# Patient Record
Sex: Female | Born: 1990
Health system: Southern US, Community
[De-identification: ages and names within clinical notes are randomized; demographics above are authoritative.]

## PROBLEM LIST (undated history)

## (undated) DIAGNOSIS — R030 Elevated blood-pressure reading, without diagnosis of hypertension: Secondary | ICD-10-CM

## (undated) DIAGNOSIS — E049 Nontoxic goiter, unspecified: Secondary | ICD-10-CM

## (undated) DIAGNOSIS — F32A Depression, unspecified: Secondary | ICD-10-CM

## (undated) DIAGNOSIS — F419 Anxiety disorder, unspecified: Secondary | ICD-10-CM

## (undated) DIAGNOSIS — R7303 Prediabetes: Secondary | ICD-10-CM

## (undated) HISTORY — DX: Depression, unspecified: F32.A

## (undated) HISTORY — DX: Nontoxic goiter, unspecified: E04.9

## (undated) HISTORY — DX: Elevated blood-pressure reading, without diagnosis of hypertension: R03.0

## (undated) HISTORY — DX: Prediabetes: R73.03

## (undated) HISTORY — DX: Anxiety disorder, unspecified: F41.9

---
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
## 2011-02-13 ENCOUNTER — Other Ambulatory Visit: Payer: Self-pay | Admitting: Certified Nurse Midwife

## 2011-02-13 DIAGNOSIS — E049 Nontoxic goiter, unspecified: Secondary | ICD-10-CM

## 2011-02-15 ENCOUNTER — Other Ambulatory Visit: Payer: Self-pay

## 2011-02-26 ENCOUNTER — Ambulatory Visit
Admission: RE | Admit: 2011-02-26 | Discharge: 2011-02-26 | Disposition: A | Discharge status: Home / Self Care | Payer: 59 | Source: Ambulatory Visit | Attending: Certified Nurse Midwife | Admitting: Certified Nurse Midwife

## 2011-02-26 DIAGNOSIS — E049 Nontoxic goiter, unspecified: Secondary | ICD-10-CM

## 2014-09-28 ENCOUNTER — Other Ambulatory Visit: Payer: Self-pay | Admitting: Physician Assistant

## 2014-09-28 DIAGNOSIS — R1084 Generalized abdominal pain: Secondary | ICD-10-CM

## 2014-09-29 ENCOUNTER — Ambulatory Visit
Admission: RE | Admit: 2014-09-29 | Discharge: 2014-09-29 | Disposition: A | Discharge status: Home / Self Care | Payer: 59 | Source: Ambulatory Visit | Attending: Physician Assistant | Admitting: Physician Assistant

## 2014-09-29 ENCOUNTER — Other Ambulatory Visit: Payer: Self-pay | Admitting: Physician Assistant

## 2014-09-29 ENCOUNTER — Encounter (INDEPENDENT_AMBULATORY_CARE_PROVIDER_SITE_OTHER): Payer: Self-pay

## 2014-09-29 DIAGNOSIS — R1084 Generalized abdominal pain: Secondary | ICD-10-CM

## 2015-03-24 ENCOUNTER — Ambulatory Visit (INDEPENDENT_AMBULATORY_CARE_PROVIDER_SITE_OTHER): Payer: 59 | Admitting: Certified Nurse Midwife

## 2015-03-24 ENCOUNTER — Encounter: Payer: Self-pay | Admitting: Certified Nurse Midwife

## 2015-03-24 VITALS — BP 126/84 | HR 76 | Resp 20 | Ht 65.75 in | Wt 201.4 lb

## 2015-03-24 DIAGNOSIS — Z124 Encounter for screening for malignant neoplasm of cervix: Secondary | ICD-10-CM

## 2015-03-24 DIAGNOSIS — Z0000 Encounter for general adult medical examination without abnormal findings: Secondary | ICD-10-CM | POA: Diagnosis not present

## 2015-03-24 DIAGNOSIS — E049 Nontoxic goiter, unspecified: Secondary | ICD-10-CM

## 2015-03-24 DIAGNOSIS — Z01419 Encounter for gynecological examination (general) (routine) without abnormal findings: Secondary | ICD-10-CM | POA: Diagnosis not present

## 2015-03-24 LAB — POCT URINALYSIS DIPSTICK
Bilirubin, UA: NEGATIVE
Glucose, UA: NEGATIVE
Ketones, UA: NEGATIVE
Nitrite, UA: NEGATIVE
PH UA: 5
PROTEIN UA: NEGATIVE
Urobilinogen, UA: NEGATIVE

## 2015-03-24 LAB — HEMOGLOBIN, FINGERSTICK: Hemoglobin, fingerstick: 12.4 g/dL (ref 12.0–16.0)

## 2015-03-24 NOTE — Progress Notes (Signed)
24 y.o. G0P0000 Single  African American Fe here for annual exam.   Periods normal, except for missed period February and then long period in 3/16 and 4/16 normal and on period today. Denies urinary symptoms of urgency, frequency or pain. Never been sexually active. Sees PCP for aex/ labs. No issues at visit. Patient working on healthy lifestyle with exercise and diet. Has lost 23 pounds. Patient has enlarged known thyroid with nodules( diagnosed here was benign appearing no follow up indicated unless change in size). No other health issues.  Patient's last menstrual period was 03/21/2015.          Sexually active: No.  The current method of family planning is Abstinence.    Exercising: Yes.    cardio qd Smoker:  no  Health Maintenance: Pap:  Never had one  MMG:  Never had one Colonoscopy: never had one  BMD:   Never had one TDaP:  2008 Labs: Hgb: 12.4   ; Urine: WBC'S +; RBC's ++ (due to being on cycle) Self breast exam: done occ   reports that she has never smoked. She does not have any smokeless tobacco history on file. She reports that she does not drink alcohol.  Past Medical History  Diagnosis Date  . Enlarged thyroid     Past Surgical History  Procedure Laterality Date  . Wisdom tooth extraction  2008    No current outpatient prescriptions on file.   No current facility-administered medications for this visit.    Family History  Problem Relation Age of Onset  . Breast cancer Maternal Grandmother [id number]  . Hypertension Mother   . High Cholesterol Father     ROS:  Pertinent items are noted in HPI.  Otherwise, a comprehensive ROS was negative.  Exam:   BP 126/84 mmHg  Pulse 76  Resp 20  Ht 5' 5.75" (1.67 m)  Wt 201 lb 6.4 oz (91.354 kg)  BMI 32.76 kg/m2  LMP 03/21/2015 Height: 5' 5.75" (167 cm) Ht Readings from Last 3 Encounters:  03/24/15 5' 5.75" (1.67 m)    General appearance: alert, cooperative and appears stated age Head: Normocephalic, without obvious  abnormality, atraumatic Neck: no adenopathy, supple, symmetrical, trachea midline and thyroid enlarged and nodular, right larger than left. Lungs: clear to auscultation bilaterally Breasts: normal appearance, no masses or tenderness, No nipple retraction or dimpling, No nipple discharge or bleeding, No axillary or supraclavicular adenopathy, large pendulous Heart: regular rate and rhythm Abdomen: soft, non-tender; no masses,  no organomegaly Extremities: extremities normal, atraumatic, no cyanosis or edema Skin: Skin color, texture, turgor normal. No rashes or lesions Lymph nodes: Cervical, supraclavicular, and axillary nodes normal. No abnormal inguinal nodes palpated Neurologic: Grossly normal   Pelvic: External genitalia:  no lesions              Urethra:  normal appearing urethra with no masses, tenderness or lesions              Bartholin's and Skene's: normal                 Vagina: normal appearing vagina with normal color and discharge, no lesions              Cervix: normal,non tender, no lesions, blood present from menses              Pap taken: Yes.   Bimanual Exam:  Uterus:  normal size, contour, position, consistency, mobility, non-tender  Adnexa: normal adnexa and no mass, fullness, tenderness               Rectovaginal: Confirms               Anus:  normal appearance  Chaperone present: Yes  A:  Well Woman with normal exam  Contraception none needed, never sexually active  Enlarged thyroid needs Thyroid US follow up  P:   Reviewed health and wellness pertinent to exam  Will advise if needed  Discussed need to check thyroid with US again to being enlarged more on right than left. Patient agreeable. Will be called with appointment.  Lab TSH  Pap smear taken today with HPV reflex   counseled on breast self exam, STD prevention, HIV risk factors and prevention, adequate intake of calcium and vitamin D, diet and exercise  return annually or prn  An After  Visit Summary was printed and given to the patient.

## 2015-03-24 NOTE — Patient Instructions (Signed)
General topics  Next pap or exam is  due in 1 year Take a Women's multivitamin Take 1200 mg. of calcium daily - prefer dietary If any concerns in interim to call back  Breast Self-Awareness Practicing breast self-awareness may pick up problems early, prevent significant medical complications, and possibly save your life. By practicing breast self-awareness, you can become familiar with how your breasts look and feel and if your breasts are changing. This allows you to notice changes early. It can also offer you some reassurance that your breast health is good. One way to learn what is normal for your breasts and whether your breasts are changing is to do a breast self-exam. If you find a lump or something that was not present in the past, it is best to contact your caregiver right away. Other findings that should be evaluated by your caregiver include nipple discharge, especially if it is bloody; skin changes or reddening; areas where the skin seems to be pulled in (retracted); or new lumps and bumps. Breast pain is seldom associated with cancer (malignancy), but should also be evaluated by a caregiver. BREAST SELF-EXAM The best time to examine your breasts is 5 7 days after your menstrual period is over.  ExitCare Patient Information 2013 [employer].   Exercise to Stay Healthy Exercise helps you become and stay healthy. EXERCISE IDEAS AND TIPS Choose exercises that:  You enjoy.  Fit into your day. You do not need to exercise really hard to be healthy. You can do exercises at a slow or medium level and stay healthy. You can:  Stretch before and after working out.  Try yoga, Pilates, or tai chi.  Lift weights.  Walk fast, swim, jog, run, climb stairs, bicycle, dance, or rollerskate.  Take aerobic classes. Exercises that burn about 150 calories:  Running 1  miles in 15 minutes.  Playing volleyball for 45 to 60 minutes.  Washing and waxing a car for 45 to 60  minutes.  Playing touch football for 45 minutes.  Walking 1  miles in 35 minutes.  Pushing a stroller 1  miles in 30 minutes.  Playing basketball for 30 minutes.  Raking leaves for 30 minutes.  Bicycling 5 miles in 30 minutes.  Walking 2 miles in 30 minutes.  Dancing for 30 minutes.  Shoveling snow for 15 minutes.  Swimming laps for 20 minutes.  Walking up stairs for 15 minutes.  Bicycling 4 miles in 15 minutes.  Gardening for 30 to 45 minutes.  Jumping rope for 15 minutes.  Washing windows or floors for 45 to 60 minutes. Document Released: 12/01/2010 Document Revised: 01/21/2012 Document Reviewed: 12/01/2010 ExitCare Patient Information 2013 [employer].   Other topics ( that may be useful information):    Sexually Transmitted Disease Sexually transmitted disease (STD) refers to any infection that is passed from person to person during sexual activity. This may happen by way of saliva, semen, blood, vaginal mucus, or urine. Common STDs include:  Gonorrhea.  Chlamydia.  Syphilis.  HIV/AIDS.  Genital herpes.  Hepatitis B and C.  Trichomonas.  Human papillomavirus (HPV).  Pubic lice. CAUSES  An STD may be spread by bacteria, virus, or parasite. A person can get an STD by:  Sexual intercourse with an infected person.  Sharing sex toys with an infected person.  Sharing needles with an infected person.  Having intimate contact with the genitals, mouth, or rectal areas of an infected person. SYMPTOMS  Some people may not have any symptoms, but   they can still pass the infection to others. Different STDs have different symptoms. Symptoms include:  Painful or bloody urination.  Pain in the pelvis, abdomen, vagina, anus, throat, or eyes.  Skin rash, itching, irritation, growths, or sores (lesions). These usually occur in the genital or anal area.  Abnormal vaginal discharge.  Penile discharge in men.  Soft, flesh-colored skin growths in the  genital or anal area.  Fever.  Pain or bleeding during sexual intercourse.  Swollen glands in the groin area.  Yellow skin and eyes (jaundice). This is seen with hepatitis. DIAGNOSIS  To make a diagnosis, your caregiver may:  Take a medical history.  Perform a physical exam.  Take a specimen (culture) to be examined.  Examine a sample of discharge under a microscope.  Perform blood test TREATMENT   Chlamydia, gonorrhea, trichomonas, and syphilis can be cured with antibiotic medicine.  Genital herpes, hepatitis, and HIV can be treated, but not cured, with prescribed medicines. The medicines will lessen the symptoms.  Genital warts from HPV can be treated with medicine or by freezing, burning (electrocautery), or surgery. Warts may come back.  HPV is a virus and cannot be cured with medicine or surgery.However, abnormal areas may be followed very closely by your caregiver and may be removed from the cervix, vagina, or vulva through office procedures or surgery. If your diagnosis is confirmed, your recent sexual partners need treatment. This is true even if they are symptom-free or have a negative culture or evaluation. They should not have sex until their caregiver says it is okay. HOME CARE INSTRUCTIONS  All sexual partners should be informed, tested, and treated for all STDs.  Take your antibiotics as directed. Finish them even if you start to feel better.  Only take over-the-counter or prescription medicines for pain, discomfort, or fever as directed by your caregiver.  Rest.  Eat a balanced diet and drink enough fluids to keep your urine clear or pale yellow.  Do not have sex until treatment is completed and you have followed up with your caregiver. STDs should be checked after treatment.  Keep all follow-up appointments, Pap tests, and blood tests as directed by your caregiver.  Only use latex condoms and water-soluble lubricants during sexual activity. Do not use  petroleum jelly or oils.  Avoid alcohol and illegal drugs.  Get vaccinated for HPV and hepatitis. If you have not received these vaccines in the past, talk to your caregiver about whether one or both might be right for you.  Avoid risky sex practices that can break the skin. The only way to avoid getting an STD is to avoid all sexual activity.Latex condoms and dental dams (for oral sex) will help lessen the risk of getting an STD, but will not completely eliminate the risk. SEEK MEDICAL CARE IF:   You have a fever.  You have any new or worsening symptoms. Document Released: 01/19/2003 Document Revised: 01/21/2012 Document Reviewed: 01/26/2011 [facility] Patient Information 2013 [employer].    Domestic Abuse You are being battered or abused if someone close to you hits, pushes, or physically hurts you in any way. You also are being abused if you are forced into activities. You are being sexually abused if you are forced to have sexual contact of any kind. You are being emotionally abused if you are made to feel worthless or if you are constantly threatened. It is important to remember that help is available. No one has the right to abuse you. PREVENTION OF FURTHER  ABUSE  Learn the warning signs of danger. This varies with situations but may include: the use of alcohol, threats, isolation from friends and family, or forced sexual contact. Leave if you feel that violence is going to occur.  If you are attacked or beaten, report it to the police so the abuse is documented. You do not have to press charges. The police can protect you while you or the attackers are leaving. Get the officer's name and badge number and a copy of the report.  Find someone you can trust and tell them what is happening to you: your caregiver, a nurse, clergy member, close friend or family member. Feeling ashamed is natural, but remember that you have done nothing wrong. No one deserves abuse. Document Released:  10/26/2000 Document Revised: 01/21/2012 Document Reviewed: 01/04/2011 ExitCare Patient Information 2013 [employer].    How Much is Too Much Alcohol? Drinking too much alcohol can cause injury, accidents, and health problems. These types of problems can include:   Car crashes.  Falls.  Family fighting (domestic violence).  Drowning.  Fights.  Injuries.  Burns.  Damage to certain organs.  Having a baby with birth defects. ONE DRINK CAN BE TOO MUCH WHEN YOU ARE:  Working.  Pregnant or breastfeeding.  Taking medicines. Ask your doctor.  Driving or planning to drive. If you or someone you know has a drinking problem, get help from a doctor.  Document Released: 08/25/2009 Document Revised: 01/21/2012 Document Reviewed: 08/25/2009 ExitCare Patient Information 2013 [employer].   Smoking Hazards Smoking cigarettes is extremely bad for your health. Tobacco smoke has over 200 known poisons in it. There are over 60 chemicals in tobacco smoke that cause cancer. Some of the chemicals found in cigarette smoke include:   Cyanide.  Benzene.  Formaldehyde.  Methanol (wood alcohol).  Acetylene (fuel used in welding torches).  Ammonia. Cigarette smoke also contains the poisonous gases nitrogen oxide and carbon monoxide.  Cigarette smokers have an increased risk of many serious medical problems and Smoking causes approximately:  90% of all lung cancer deaths in men.  80% of all lung cancer deaths in women.  90% of deaths from chronic obstructive lung disease. Compared with nonsmokers, smoking increases the risk of:  Coronary heart disease by 2 to 4 times.  Stroke by 2 to 4 times.  Men developing lung cancer by 23 times.  Women developing lung cancer by 13 times.  Dying from chronic obstructive lung diseases by 12 times.  . Smoking is the most preventable cause of death and disease in our society.  WHY IS SMOKING ADDICTIVE?  Nicotine is the chemical  agent in tobacco that is capable of causing addiction or dependence.  When you smoke and inhale, nicotine is absorbed rapidly into the bloodstream through your lungs. Nicotine absorbed through the lungs is capable of creating a powerful addiction. Both inhaled and non-inhaled nicotine may be addictive.  Addiction studies of cigarettes and spit tobacco show that addiction to nicotine occurs mainly during the teen years, when young people begin using tobacco products. WHAT ARE THE BENEFITS OF QUITTING?  There are many health benefits to quitting smoking.   Likelihood of developing cancer and heart disease decreases. Health improvements are seen almost immediately.  Blood pressure, pulse rate, and breathing patterns start returning to normal soon after quitting. QUITTING SMOKING   American Lung Association - 1-800-LUNGUSA  American Cancer Society - 1-800-ACS-2345 Document Released: 12/06/2004 Document Revised: 01/21/2012 Document Reviewed: 08/10/2009 ExitCare Patient Information 2013 ExitCare,   LLC.   Stress Management Stress is a state of physical or mental tension that often results from changes in your life or normal routine. Some common causes of stress are:  Death of a loved one.  Injuries or severe illnesses.  Getting fired or changing jobs.  Moving into a new home. Other causes may be:  Sexual problems.  Business or financial losses.  Taking on a large debt.  Regular conflict with someone at home or at work.  Constant tiredness from lack of sleep. It is not just bad things that are stressful. It may be stressful to:  Win the lottery.  Get married.  Buy a new car. The amount of stress that can be easily tolerated varies from person to person. Changes generally cause stress, regardless of the types of change. Too much stress can affect your health. It may lead to physical or emotional problems. Too little stress (boredom) may also become stressful. SUGGESTIONS TO  REDUCE STRESS:  Talk things over with your family and friends. It often is helpful to share your concerns and worries. If you feel your problem is serious, you may want to get help from a professional counselor.  Consider your problems one at a time instead of lumping them all together. Trying to take care of everything at once may seem impossible. List all the things you need to do and then start with the most important one. Set a goal to accomplish 2 or 3 things each day. If you expect to do too many in a single day you will naturally fail, causing you to feel even more stressed.  Do not use alcohol or drugs to relieve stress. Although you may feel better for a short time, they do not remove the problems that caused the stress. They can also be habit forming.  Exercise regularly - at least 3 times per week. Physical exercise can help to relieve that "uptight" feeling and will relax you.  The shortest distance between despair and hope is often a good night's sleep.  Go to bed and get up on time allowing yourself time for appointments without being rushed.  Take a short "time-out" period from any stressful situation that occurs during the day. Close your eyes and take some deep breaths. Starting with the muscles in your face, tense them, hold it for a few seconds, then relax. Repeat this with the muscles in your neck, shoulders, hand, stomach, back and legs.  Take good care of yourself. Eat a balanced diet and get plenty of rest.  Schedule time for having fun. Take a break from your daily routine to relax. HOME CARE INSTRUCTIONS   Call if you feel overwhelmed by your problems and feel you can no longer manage them on your own.  Return immediately if you feel like hurting yourself or someone else. Document Released: 04/24/2001 Document Revised: 01/21/2012 Document Reviewed: 12/15/2007 ExitCare Patient Information 2013 ExitCare, [employer].   

## 2015-03-25 LAB — THYROID PANEL WITH TSH
FREE THYROXINE INDEX: 2 (ref 1.4–3.8)
T3 UPTAKE: 31 % (ref 22–35)
T4 TOTAL: 6.3 ug/dL (ref 4.5–12.0)
TSH: 1.852 u[IU]/mL (ref 0.350–4.500)

## 2015-03-28 LAB — IPS PAP TEST WITH REFLEX TO HPV

## 2015-03-28 NOTE — Progress Notes (Signed)
Reviewed personally.  M. Suzanne Ikechukwu Cerny, MD.  

## 2015-04-15 ENCOUNTER — Encounter: Payer: Self-pay | Admitting: Certified Nurse Midwife

## 2015-04-15 ENCOUNTER — Ambulatory Visit (INDEPENDENT_AMBULATORY_CARE_PROVIDER_SITE_OTHER): Payer: 59 | Admitting: Certified Nurse Midwife

## 2015-04-15 VITALS — BP 120/80 | HR 68 | Resp 16 | Ht 65.75 in | Wt 198.0 lb

## 2015-04-15 DIAGNOSIS — Z124 Encounter for screening for malignant neoplasm of cervix: Secondary | ICD-10-CM

## 2015-04-15 NOTE — Progress Notes (Signed)
.  24 y.o. G0P0000 SingleAfrican American  F here for repeat Pap smear due to history of repeat pap due to unsatisfactory due to on menses unable to read. on pap done 03/24/15. No other health issues.   Today she denies vaginal symptoms or STD concerns.  Patient's last menstrual period was 03/21/2015.Marland Kitchen.  Contraception:  EXAM: BP 120/80 mmHg  Pulse 68  Resp 16  Ht 5' 5.75" (1.67 m)  Wt 198 lb (89.812 kg)  BMI 32.20 kg/m2  LMP 03/21/2015 General appearance:  WNWD Female, NAD Pelvic exam: VULVA: normal appearing vulva with no masses, tenderness or lesions,  VAGINA: normal appearing vagina with normal color and discharge, no lesions,  CERVIX: normal appearing cervix without discharge or lesions. Pap smear obtained. Declines pelvic exam  Assessment: Unsatisfactory pap smear due to menses  Plan: Patient will be notified of pap results.  Rv prn

## 2015-04-18 LAB — IPS PAP TEST WITH REFLEX TO HPV

## 2015-04-18 NOTE — Progress Notes (Signed)
Reviewed personally.  M. Suzanne Stevie [place], MD.  

## 2015-04-29 ENCOUNTER — Ambulatory Visit
Admission: RE | Admit: 2015-04-29 | Discharge: 2015-04-29 | Disposition: A | Discharge status: Home / Self Care | Payer: 59 | Source: Ambulatory Visit | Attending: Certified Nurse Midwife | Admitting: Certified Nurse Midwife

## 2015-04-29 DIAGNOSIS — E049 Nontoxic goiter, unspecified: Secondary | ICD-10-CM

## 2015-05-17 ENCOUNTER — Telehealth: Payer: Self-pay | Admitting: Emergency Medicine

## 2015-05-17 DIAGNOSIS — E042 Nontoxic multinodular goiter: Secondary | ICD-10-CM

## 2015-05-17 NOTE — Telephone Encounter (Signed)
-----   Message from Verner Choleborah S Leonard, CNM sent at 05/17/2015  8:04 AM EDT ----- Notify patient that US showed multi nodular thyroid and feel she should be referred to Endocrinology for follow up . Please refer

## 2015-05-17 NOTE — Telephone Encounter (Signed)
Message left to return call to Valeria Krisko at [phone].    

## 2015-05-19 NOTE — Telephone Encounter (Signed)
Call to patient. She is given message from Verner Choleborah S. Leonard CNM and is agreeable to referral.   Referral placed to [facility].

## 2015-05-19 NOTE — Telephone Encounter (Signed)
Referral placed for Dr. Romero BellingSean Ellison at [facility]. Patient scheduled for 05/26/15 at 1:45 arrival. Patient contacted and given detailed appointment information and agreeable. Routing to provider for final review. Patient agreeable to disposition. Will close encounter.    cc Lilyan GilfordBecky Frahm

## 2015-05-26 ENCOUNTER — Encounter: Payer: Self-pay | Admitting: Endocrinology

## 2015-05-26 ENCOUNTER — Ambulatory Visit (INDEPENDENT_AMBULATORY_CARE_PROVIDER_SITE_OTHER): Payer: 59 | Admitting: Endocrinology

## 2015-05-26 VITALS — BP 132/84 | HR 62 | Temp 98.3°F | Ht 65.75 in | Wt 194.0 lb

## 2015-05-26 DIAGNOSIS — E042 Nontoxic multinodular goiter: Secondary | ICD-10-CM | POA: Diagnosis not present

## 2015-05-26 NOTE — Progress Notes (Signed)
   Subjective:    Patient ID: Robin Hunt, female    DOB: 12-Oct-1991, 24 y.o.   MRN: [id number]  HPI Pt was noted on a routine physical, to have a goiter at the thyroid in 2012.  she has no h/o XRT or surgery to the neck.  She has a slight sensation swelling at the right lateral neck, but no assoc pain.   Past Medical History  Diagnosis Date  . Enlarged thyroid     Past Surgical History  Procedure Laterality Date  . Wisdom tooth extraction  2008    History   Social History  . Marital Status: Single    Spouse Name: N/A  . Number of Children: N/A  . Years of Education: N/A   Occupational History  . Not on file.   Social History Main Topics  . Smoking status: Never Smoker   . Smokeless tobacco: Not on file  . Alcohol Use: No  . Drug Use: Not on file  . Sexual Activity: No   Other Topics Concern  . Not on file   Social History Narrative    No current outpatient prescriptions on file prior to visit.   No current facility-administered medications on file prior to visit.    Allergies  Allergen Reactions  . Septra [Sulfamethoxazole-Trimethoprim] Hives    Family History  Problem Relation Age of Onset  . Breast cancer Maternal Grandmother [id number]  . Hypertension Mother   . High Cholesterol Father   . Thyroid disease Neg Hx     BP 132/84 mmHg  Pulse 62  Temp(Src) 98.3 F (36.8 C) (Oral)  Ht 5' 5.75" (1.67 m)  Wt 194 lb (87.998 kg)  BMI 31.55 kg/m2  SpO2 98%  LMP 05/16/2015   Review of Systems Denies dysphagia, sob, depression, hair loss, muscle cramps, constipation, numbness, blurry vision, myalgias, dry skin, rhinorrhea, easy bruising, and syncope.  She has chronic weight gain and cold intolerance.  She has irreg menses.      Objective:   Physical Exam VITAL SIGNS:  See vs page GENERAL: no distress eyes: no periorbital swelling, no proptosis NECK: There is no palpable thyroid enlargement.  No thyroid nodule is palpable.   NODES: no palpable  lymphadenopathy at the anterior neck. Skin: not diaphoretic.   Neuro: no tremor.  EXT: no edema PSYCH: Alert and well-oriented.  Does not appear anxious nor depressed.    Radiol: thyroid US (04/29/15): Multinodular thyroid, with none of the lesions meeting criteria for Biopsy.  Lab Results  Component Value Date   TSH 1.852 03/24/2015   T4TOTAL 6.3 03/24/2015   i have reviewed outside records: ov of 03/24/15): pt was noted to have h/o multinodular goiter, and US was rechecked.      Assessment & Plan:  Multinodular goiter, new to me.   Weight gain, and other sxs: I told pt these sxs are not thyroid-related, and should be addressed separately.    Patient is advised the following: Patient Instructions  Please have the thyroid ultrasound rechecked in 2 years.  You could come back here for an appointment then, or Ms. Redmon could request for you. most of the time, a "lumpy thyroid" will eventually become overactive.  this is usually a slow process, happening over the span of many years.

## 2015-05-26 NOTE — Patient Instructions (Signed)
Please have the thyroid ultrasound rechecked in 2 years.  You could come back here for an appointment then, or Ms. Redmon could request for you. most of the time, a "lumpy thyroid" will eventually become overactive.  this is usually a slow process, happening over the span of many years.

## 2015-05-28 DIAGNOSIS — E042 Nontoxic multinodular goiter: Secondary | ICD-10-CM | POA: Insufficient documentation

## 2015-08-04 IMAGING — US US SOFT TISSUE HEAD/NECK
1 series · 14 of 25 positions shown · non-contrast
Comparison: 02/26/2011

CLINICAL DATA: 23-year-old female with a history of thyroid
nodules.

EXAM:
THYROID ULTRASOUND
TECHNIQUE: Ultrasound examination of the thyroid gland and adjacent soft
tissues was performed.

[Series 1: us soft tissue head/neck · 0.07mm/px · 14 of 83 slices shown]
[im 1/83]
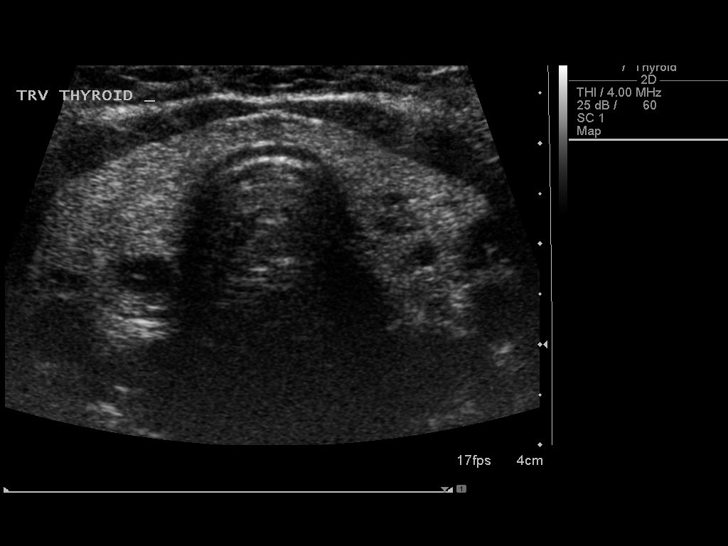
[im 7/83]
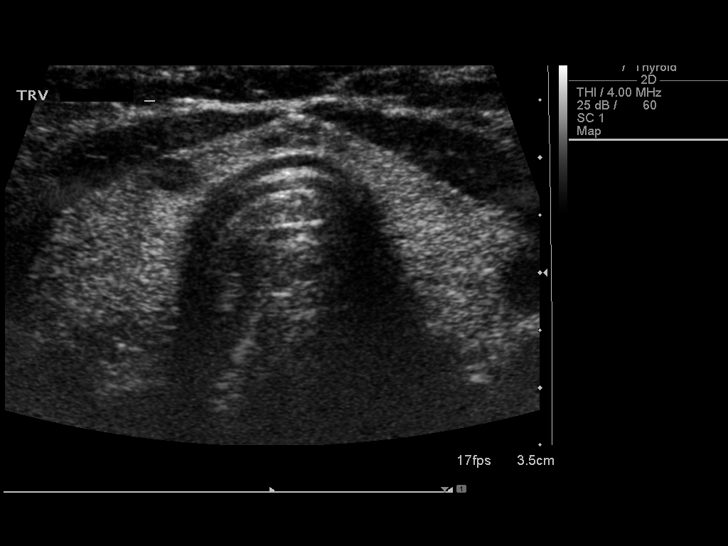
[im 14/83]
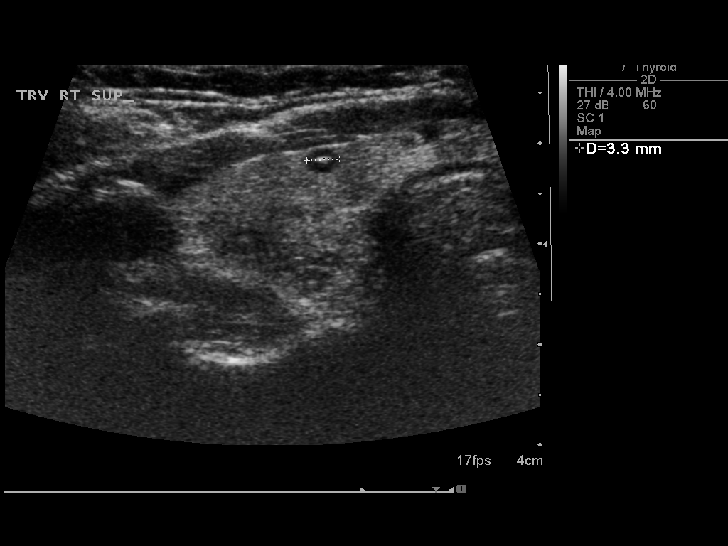
[im 21/83]
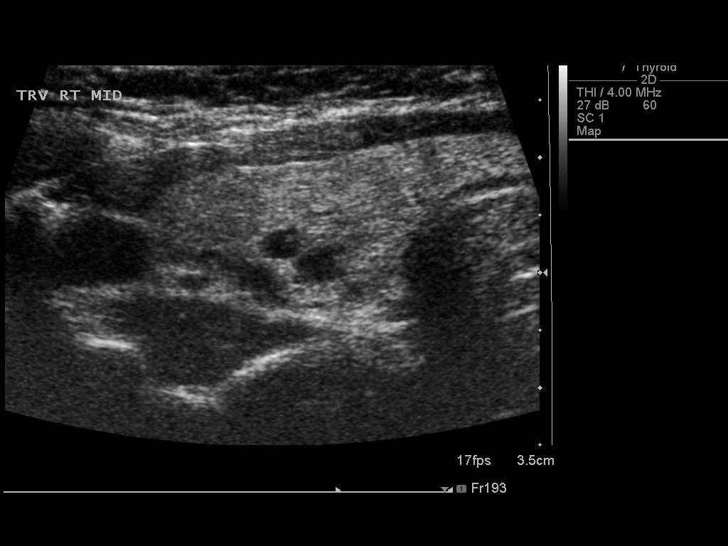
[im 28/83]
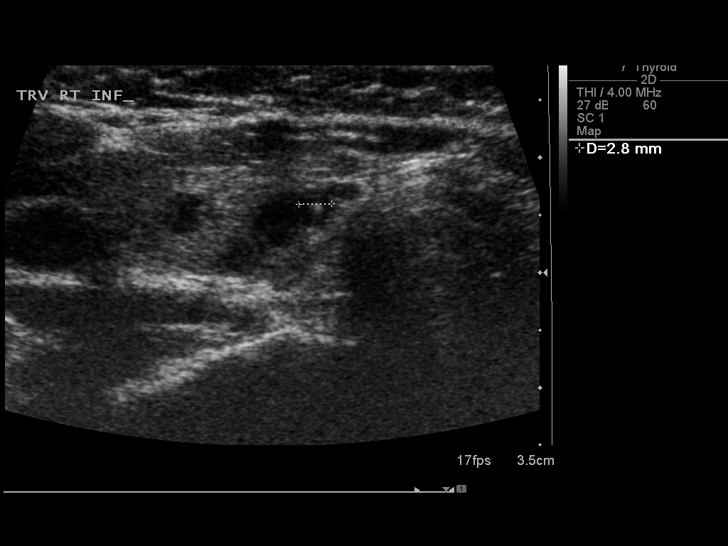
[im 31/83]
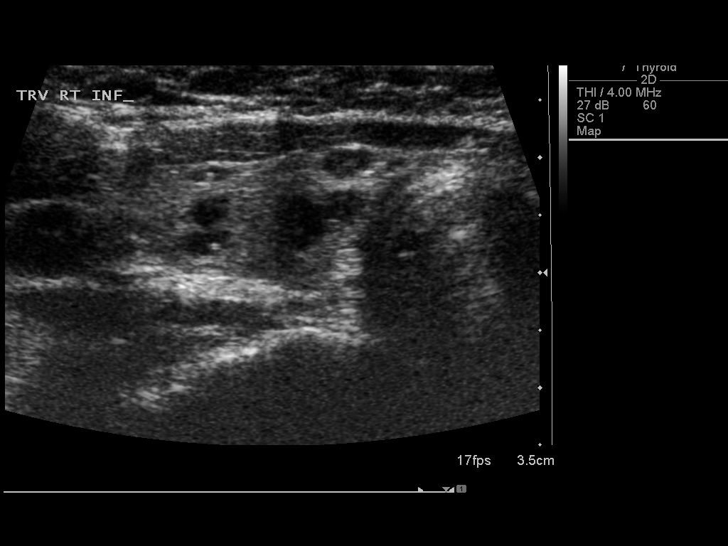
[im 38/83]
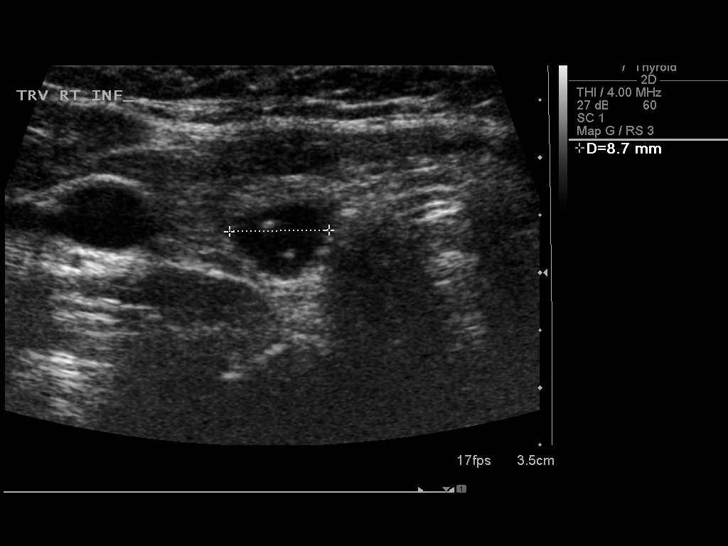
[im 45/83]
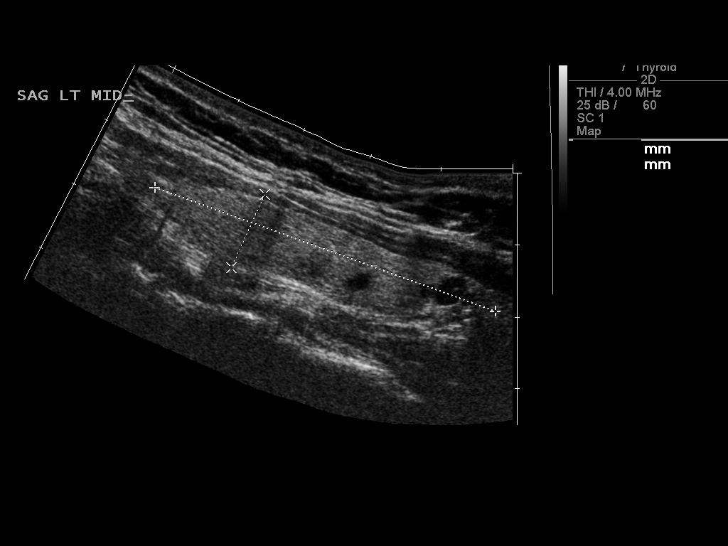
[im 52/83]
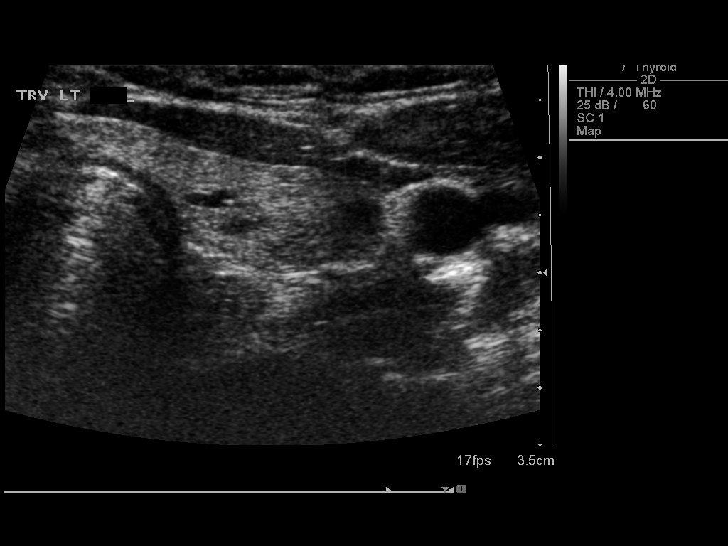
[im 55/83]
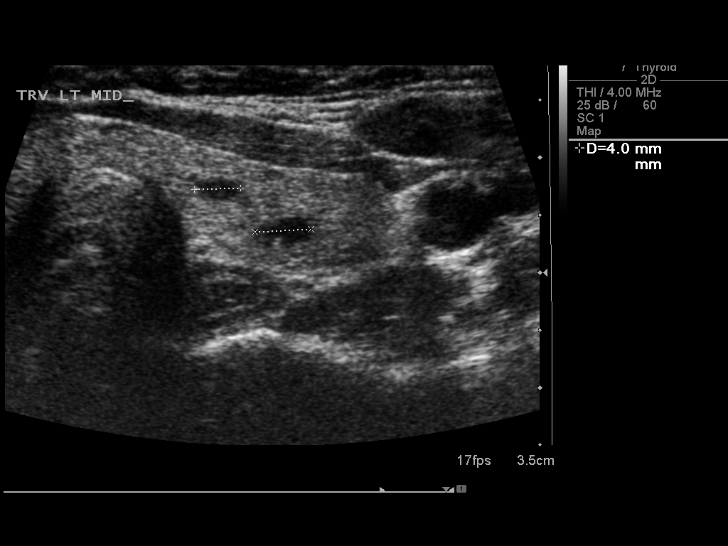
[im 62/83]
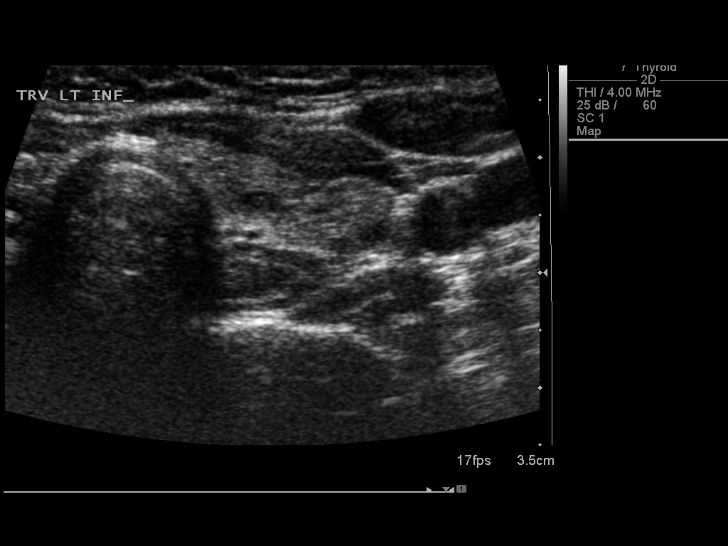
[im 69/83]
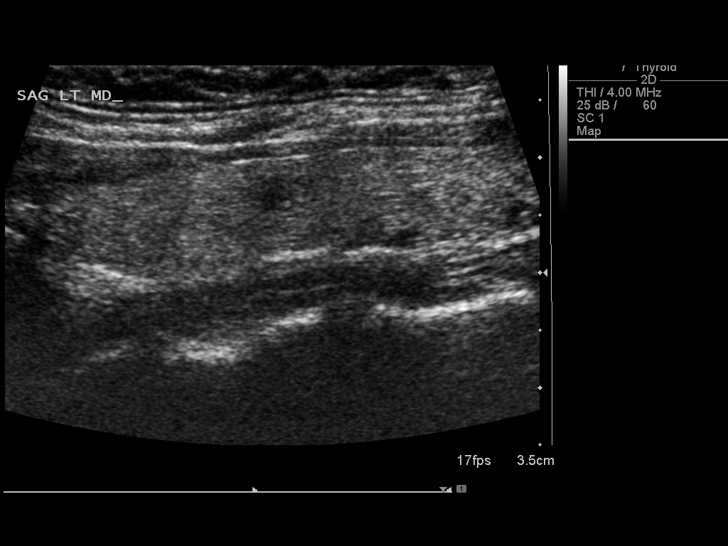
[im 76/83]
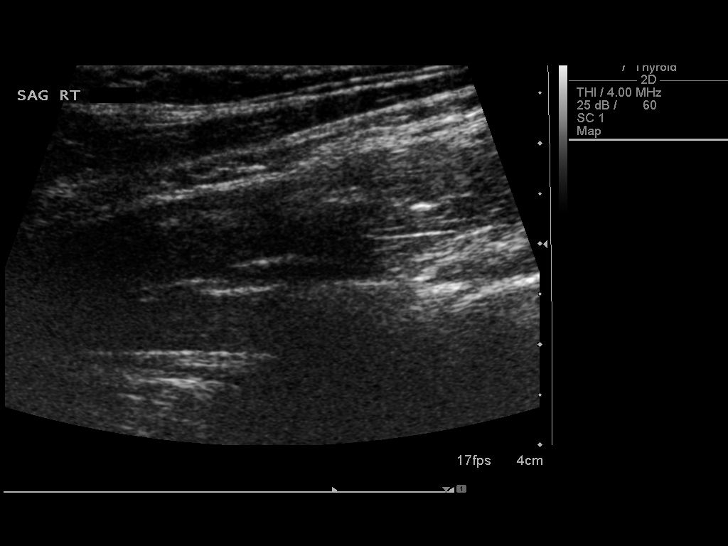
[im 83/83]
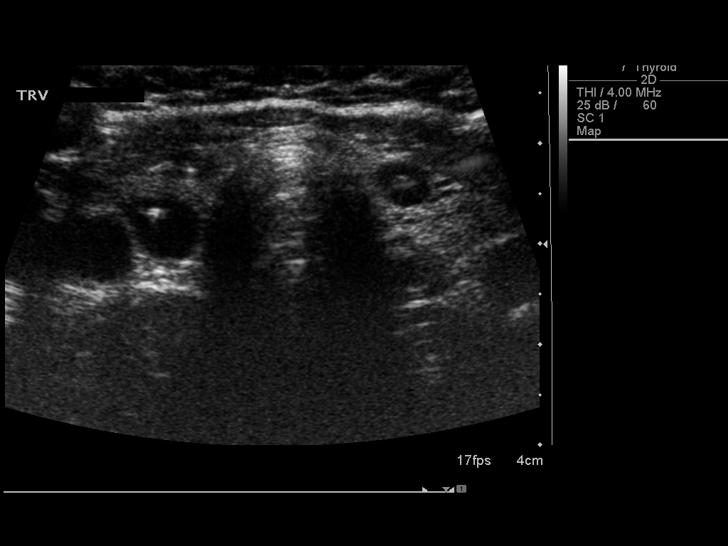

[14 of 25 positions shown; findings below may reference images not displayed]

FINDINGS: Right thyroid lobe

Measurements: 6.0 cm x 1.5 cm x 1.7 cm. Multiple right-sided
hypoechoic nodules, similar to the comparison and none of which
measure greater than 9 mm.

Left thyroid lobe

Measurements: 5.0 cm x 1.1 cm x 1.9 cm. Multiple left-sided nodules.
Largest left nodule measures 7 mm x 1.4 cm x 6 mm

Isthmus

Thickness: 4 mm.  No nodules visualized.

Lymphadenopathy

None visualized.
IMPRESSION: Multinodular thyroid, with none of the lesions meeting criteria for
biopsy.

Findings do not meet current SRU consensus criteria for biopsy.
Follow-up by clinical exam is recommended. If patient has known risk
factors for thyroid carcinoma, consider follow-up ultrasound in 12
months. If patient is clinically hyperthyroid, consider nuclear
medicine thyroid uptake and scan.Reference: Management of Thyroid
Nodules Detected at US: Society of Radiologists in Ultrasound

## 2015-10-25 ENCOUNTER — Telehealth: Payer: Self-pay | Admitting: Certified Nurse Midwife

## 2015-10-25 NOTE — Telephone Encounter (Signed)
Patient would like a referring letter for breast reduction surgery faxed to her plastic surgeon's office. [facility] phone number is [phone] and fax number [phone].

## 2015-10-25 NOTE — Telephone Encounter (Signed)
Routing to provider, Leota Sauerseborah Leonard CNM for review. Okay to write letter?

## 2015-10-25 NOTE — Telephone Encounter (Signed)
yes

## 2015-10-26 ENCOUNTER — Encounter: Payer: Self-pay | Admitting: Emergency Medicine

## 2015-10-26 NOTE — Telephone Encounter (Signed)
Message left to return call to Smith Mcnicholas at [phone].    

## 2015-11-01 NOTE — Telephone Encounter (Signed)
Late entry for 10/26/15.  Patient returned call and she is advised that letter has been [facility] to [facility]. Fax confirmation received.  Encounter closed.

## 2015-11-08 HISTORY — PX: BREAST REDUCTION SURGERY: SHX8

## 2016-03-27 ENCOUNTER — Ambulatory Visit (INDEPENDENT_AMBULATORY_CARE_PROVIDER_SITE_OTHER): Payer: BLUE CROSS/BLUE SHIELD | Admitting: Certified Nurse Midwife

## 2016-03-27 ENCOUNTER — Encounter: Payer: Self-pay | Admitting: Certified Nurse Midwife

## 2016-03-27 VITALS — BP 120/72 | HR 72 | Resp 16 | Ht 65.25 in | Wt 208.0 lb

## 2016-03-27 DIAGNOSIS — E049 Nontoxic goiter, unspecified: Secondary | ICD-10-CM

## 2016-03-27 DIAGNOSIS — Z01419 Encounter for gynecological examination (general) (routine) without abnormal findings: Secondary | ICD-10-CM | POA: Diagnosis not present

## 2016-03-27 NOTE — Progress Notes (Signed)
Reviewed personally.  M. Suzanne Winner Valeriano, MD.  

## 2016-03-27 NOTE — Patient Instructions (Signed)
General topics  Next pap or exam is  due in 1 year Take a Women's multivitamin Take 1200 mg. of calcium daily - prefer dietary If any concerns in interim to call back  Breast Self-Awareness Practicing breast self-awareness may pick up problems early, prevent significant medical complications, and possibly save your life. By practicing breast self-awareness, you can become familiar with how your breasts look and feel and if your breasts are changing. This allows you to notice changes early. It can also offer you some reassurance that your breast health is good. One way to learn what is normal for your breasts and whether your breasts are changing is to do a breast self-exam. If you find a lump or something that was not present in the past, it is best to contact your caregiver right away. Other findings that should be evaluated by your caregiver include nipple discharge, especially if it is bloody; skin changes or reddening; areas where the skin seems to be pulled in (retracted); or new lumps and bumps. Breast pain is seldom associated with cancer (malignancy), but should also be evaluated by a caregiver. BREAST SELF-EXAM The best time to examine your breasts is 5 7 days after your menstrual period is over.  ExitCare Patient Information 2013 [employer].   Exercise to Stay Healthy Exercise helps you become and stay healthy. EXERCISE IDEAS AND TIPS Choose exercises that:  You enjoy.  Fit into your day. You do not need to exercise really hard to be healthy. You can do exercises at a slow or medium level and stay healthy. You can:  Stretch before and after working out.  Try yoga, Pilates, or tai chi.  Lift weights.  Walk fast, swim, jog, run, climb stairs, bicycle, dance, or rollerskate.  Take aerobic classes. Exercises that burn about 150 calories:  Running 1  miles in 15 minutes.  Playing volleyball for 45 to 60 minutes.  Washing and waxing a car for 45 to 60  minutes.  Playing touch football for 45 minutes.  Walking 1  miles in 35 minutes.  Pushing a stroller 1  miles in 30 minutes.  Playing basketball for 30 minutes.  Raking leaves for 30 minutes.  Bicycling 5 miles in 30 minutes.  Walking 2 miles in 30 minutes.  Dancing for 30 minutes.  Shoveling snow for 15 minutes.  Swimming laps for 20 minutes.  Walking up stairs for 15 minutes.  Bicycling 4 miles in 15 minutes.  Gardening for 30 to 45 minutes.  Jumping rope for 15 minutes.  Washing windows or floors for 45 to 60 minutes. Document Released: 12/01/2010 Document Revised: 01/21/2012 Document Reviewed: 12/01/2010 ExitCare Patient Information 2013 [employer].   Other topics ( that may be useful information):    Sexually Transmitted Disease Sexually transmitted disease (STD) refers to any infection that is passed from person to person during sexual activity. This may happen by way of saliva, semen, blood, vaginal mucus, or urine. Common STDs include:  Gonorrhea.  Chlamydia.  Syphilis.  HIV/AIDS.  Genital herpes.  Hepatitis B and C.  Trichomonas.  Human papillomavirus (HPV).  Pubic lice. CAUSES  An STD may be spread by bacteria, virus, or parasite. A person can get an STD by:  Sexual intercourse with an infected person.  Sharing sex toys with an infected person.  Sharing needles with an infected person.  Having intimate contact with the genitals, mouth, or rectal areas of an infected person. SYMPTOMS  Some people may not have any symptoms, but   they can still pass the infection to others. Different STDs have different symptoms. Symptoms include:  Painful or bloody urination.  Pain in the pelvis, abdomen, vagina, anus, throat, or eyes.  Skin rash, itching, irritation, growths, or sores (lesions). These usually occur in the genital or anal area.  Abnormal vaginal discharge.  Penile discharge in men.  Soft, flesh-colored skin growths in the  genital or anal area.  Fever.  Pain or bleeding during sexual intercourse.  Swollen glands in the groin area.  Yellow skin and eyes (jaundice). This is seen with hepatitis. DIAGNOSIS  To make a diagnosis, your caregiver may:  Take a medical history.  Perform a physical exam.  Take a specimen (culture) to be examined.  Examine a sample of discharge under a microscope.  Perform blood test TREATMENT   Chlamydia, gonorrhea, trichomonas, and syphilis can be cured with antibiotic medicine.  Genital herpes, hepatitis, and HIV can be treated, but not cured, with prescribed medicines. The medicines will lessen the symptoms.  Genital warts from HPV can be treated with medicine or by freezing, burning (electrocautery), or surgery. Warts may come back.  HPV is a virus and cannot be cured with medicine or surgery.However, abnormal areas may be followed very closely by your caregiver and may be removed from the cervix, vagina, or vulva through office procedures or surgery. If your diagnosis is confirmed, your recent sexual partners need treatment. This is true even if they are symptom-free or have a negative culture or evaluation. They should not have sex until their caregiver says it is okay. HOME CARE INSTRUCTIONS  All sexual partners should be informed, tested, and treated for all STDs.  Take your antibiotics as directed. Finish them even if you start to feel better.  Only take over-the-counter or prescription medicines for pain, discomfort, or fever as directed by your caregiver.  Rest.  Eat a balanced diet and drink enough fluids to keep your urine clear or pale yellow.  Do not have sex until treatment is completed and you have followed up with your caregiver. STDs should be checked after treatment.  Keep all follow-up appointments, Pap tests, and blood tests as directed by your caregiver.  Only use latex condoms and water-soluble lubricants during sexual activity. Do not use  petroleum jelly or oils.  Avoid alcohol and illegal drugs.  Get vaccinated for HPV and hepatitis. If you have not received these vaccines in the past, talk to your caregiver about whether one or both might be right for you.  Avoid risky sex practices that can break the skin. The only way to avoid getting an STD is to avoid all sexual activity.Latex condoms and dental dams (for oral sex) will help lessen the risk of getting an STD, but will not completely eliminate the risk. SEEK MEDICAL CARE IF:   You have a fever.  You have any new or worsening symptoms. Document Released: 01/19/2003 Document Revised: 01/21/2012 Document Reviewed: 01/26/2011 [facility] Patient Information 2013 [place].    Domestic Abuse You are being battered or abused if someone close to you hits, pushes, or physically hurts you in any way. You also are being abused if you are forced into activities. You are being sexually abused if you are forced to have sexual contact of any kind. You are being emotionally abused if you are made to feel worthless or if you are constantly threatened. It is important to remember that help is available. No one has the right to abuse you. PREVENTION OF FURTHER  ABUSE  Learn the warning signs of danger. This varies with situations but may include: the use of alcohol, threats, isolation from friends and family, or forced sexual contact. Leave if you feel that violence is going to occur.  If you are attacked or beaten, report it to the police so the abuse is documented. You do not have to press charges. The police can protect you while you or the attackers are leaving. Get the officer's name and badge number and a copy of the report.  Find someone you can trust and tell them what is happening to you: your caregiver, a nurse, clergy member, close friend or family member. Feeling ashamed is natural, but remember that you have done nothing wrong. No one deserves abuse. Document Released:  10/26/2000 Document Revised: 01/21/2012 Document Reviewed: 01/04/2011 [facility] Patient Information 2013 [place].    How Much is Too Much Alcohol? Drinking too much alcohol can cause injury, accidents, and health problems. These types of problems can include:   Car crashes.  Falls.  Family fighting (domestic violence).  Drowning.  Fights.  Injuries.  Burns.  Damage to certain organs.  Having a baby with birth defects. ONE DRINK CAN BE TOO MUCH WHEN YOU ARE:  Working.  Pregnant or breastfeeding.  Taking medicines. Ask your doctor.  Driving or planning to drive. If you or someone you know has a drinking problem, get help from a doctor.  Document Released: 08/25/2009 Document Revised: 01/21/2012 Document Reviewed: 08/25/2009 [facility] Patient Information 2013 [place].   Smoking Hazards Smoking cigarettes is extremely bad for your health. Tobacco smoke has over 200 known poisons in it. There are over 60 chemicals in tobacco smoke that cause cancer. Some of the chemicals found in cigarette smoke include:   Cyanide.  Benzene.  Formaldehyde.  Methanol (wood alcohol).  Acetylene (fuel used in welding torches).  Ammonia. Cigarette smoke also contains the poisonous gases nitrogen oxide and carbon monoxide.  Cigarette smokers have an increased risk of many serious medical problems and Smoking causes approximately:  90% of all lung cancer deaths in men.  80% of all lung cancer deaths in women.  90% of deaths from chronic obstructive lung disease. Compared with nonsmokers, smoking increases the risk of:  Coronary heart disease by 2 to 4 times.  Stroke by 2 to 4 times.  Men developing lung cancer by 23 times.  Women developing lung cancer by 13 times.  Dying from chronic obstructive lung diseases by 12 times.  . Smoking is the most preventable cause of death and disease in our society.  WHY IS SMOKING ADDICTIVE?  Nicotine is the chemical  agent in tobacco that is capable of causing addiction or dependence.  When you smoke and inhale, nicotine is absorbed rapidly into the bloodstream through your lungs. Nicotine absorbed through the lungs is capable of creating a powerful addiction. Both inhaled and non-inhaled nicotine may be addictive.  Addiction studies of cigarettes and spit tobacco show that addiction to nicotine occurs mainly during the teen years, when young people begin using tobacco products. WHAT ARE THE BENEFITS OF QUITTING?  There are many health benefits to quitting smoking.   Likelihood of developing cancer and heart disease decreases. Health improvements are seen almost immediately.  Blood pressure, pulse rate, and breathing patterns start returning to normal soon after quitting. QUITTING SMOKING   American Lung Association - 1-800-LUNGUSA  American Cancer Society - 1-800-ACS-2345 Document Released: 12/06/2004 Document Revised: 01/21/2012 Document Reviewed: 08/10/2009 [facility] Patient Information 2013 [place].   Stress Management Stress is a state of physical or mental tension that often results from changes in your life or normal routine. Some common causes of stress are:  Death of a loved one.  Injuries or severe illnesses.  Getting fired or changing jobs.  Moving into a new home. Other causes may be:  Sexual problems.  Business or financial losses.  Taking on a large debt.  Regular conflict with someone at home or at work.  Constant tiredness from lack of sleep. It is not just bad things that are stressful. It may be stressful to:  Win the lottery.  Get married.  Buy a new car. The amount of stress that can be easily tolerated varies from person to person. Changes generally cause stress, regardless of the types of change. Too much stress can affect your health. It may lead to physical or emotional problems. Too little stress (boredom) may also become stressful. SUGGESTIONS TO  REDUCE STRESS:  Talk things over with your family and friends. It often is helpful to share your concerns and worries. If you feel your problem is serious, you may want to get help from a professional counselor.  Consider your problems one at a time instead of lumping them all together. Trying to take care of everything at once may seem impossible. List all the things you need to do and then start with the most important one. Set a goal to accomplish 2 or 3 things each day. If you expect to do too many in a single day you will naturally fail, causing you to feel even more stressed.  Do not use alcohol or drugs to relieve stress. Although you may feel better for a short time, they do not remove the problems that caused the stress. They can also be habit forming.  Exercise regularly - at least 3 times per week. Physical exercise can help to relieve that "uptight" feeling and will relax you.  The shortest distance between despair and hope is often a good night's sleep.  Go to bed and get up on time allowing yourself time for appointments without being rushed.  Take a short "time-out" period from any stressful situation that occurs during the day. Close your eyes and take some deep breaths. Starting with the muscles in your face, tense them, hold it for a few seconds, then relax. Repeat this with the muscles in your neck, shoulders, hand, stomach, back and legs.  Take good care of yourself. Eat a balanced diet and get plenty of rest.  Schedule time for having fun. Take a break from your daily routine to relax. HOME CARE INSTRUCTIONS   Call if you feel overwhelmed by your problems and feel you can no longer manage them on your own.  Return immediately if you feel like hurting yourself or someone else. Document Released: 04/24/2001 Document Revised: 01/21/2012 Document Reviewed: 12/15/2007 ExitCare Patient Information 2013 [employer].   

## 2016-03-27 NOTE — Progress Notes (Signed)
25 y.o. G0P0000 Single  African American Fe here for annual exam. Periods normal, no issues. Not sexually active. No STD screening needed. Sees PCP Noelle Reaman if needed. Had breast reduction done in 2016 and very happy with results. Now plans working on weight loss. Does not feel like thyroid has enlarged and no issues at this point. No other health issues today.  Patient's last menstrual period was 03/05/2016.          Sexually active: No.  The current method of family planning is abstinence.    Exercising: Yes.    cardio & weights Smoker:  no  Health Maintenance: Pap:  04-15-15 neg MMG:  none Colonoscopy:  none BMD:   none TDaP:  2008 Shingles: no Pneumonia: no Hep C and HIV: none Labs: none Self breast exam: done occ   reports that she has never smoked. She does not have any smokeless tobacco history on file. She reports that she does not drink alcohol or use illicit drugs.  Past Medical History  Diagnosis Date  . Enlarged thyroid     Past Surgical History  Procedure Laterality Date  . Wisdom tooth extraction  2008    No current outpatient prescriptions on file.   No current facility-administered medications for this visit.    Family History  Problem Relation Age of Onset  . Breast cancer Maternal Grandmother [id number]  . Hypertension Mother   . High Cholesterol Father   . Thyroid disease Neg Hx     ROS:  Pertinent items are noted in HPI.  Otherwise, a comprehensive ROS was negative.  Exam:   BP 120/72 mmHg  Pulse 72  Resp 16  Ht 5' 5.25" (1.657 m)  Wt 208 lb (94.348 kg)  BMI 34.36 kg/m2  LMP 03/05/2016 Height: 5' 5.25" (165.7 cm) Ht Readings from Last 3 Encounters:  03/27/16 5' 5.25" (1.657 m)  05/26/15 5' 5.75" (1.67 m)  04/15/15 5' 5.75" (1.67 m)    General appearance: alert, cooperative and appears stated age Head: Normocephalic, without obvious abnormality, atraumatic Neck: no adenopathy, supple, symmetrical, trachea midline and thyroid enlarged,  nodular and known enlargement and nodules Lungs: clear to auscultation bilaterally Breasts: normal appearance, no masses or tenderness, No nipple retraction or dimpling, No nipple discharge or bleeding, No axillary or supraclavicular adenopathy, reduction surgery scarring bilateral Heart: regular rate and rhythm Abdomen: soft, non-tender; no masses,  no organomegaly Extremities: extremities normal, atraumatic, no cyanosis or edema Skin: Skin color, texture, turgor normal. No rashes or lesions Lymph nodes: Cervical, supraclavicular, and axillary nodes normal. No abnormal inguinal nodes palpated Neurologic: Grossly normal   Pelvic: External genitalia:  no lesions              Urethra:  normal appearing urethra with no masses, tenderness or lesions              Bartholin's and Skene's: normal                 Vagina: normal appearing vagina with normal color and discharge, no lesions              Cervix: no cervical motion tenderness, no lesions and nulliparous appearance              Pap taken: No. Bimanual Exam:  Uterus:  normal size, contour, position, consistency, mobility, non-tender and anteverted              Adnexa: normal adnexa and no mass, fullness, tenderness  Rectovaginal: Confirms               Anus:  normal appearance  Chaperone present: yes  A:  Well Woman with normal exam  Contraception none needed  Recent bilateral breast reduction, happy with outcome  Enlarged thyroid with nodules, known, no change in size, [place] due next year and follow up with Dr. Everardo All  P:   Reviewed health and wellness pertinent to exam  Will advise if needed  Patient aware to advise if changes in size or problems.  Pap smear as above not taken   counseled on breast self exam, STD prevention, HIV risk factors and prevention, adequate intake of calcium and vitamin D, diet and exercise  return annually or prn  An After Visit Summary was printed and given to the patient.

## 2017-03-28 ENCOUNTER — Ambulatory Visit: Payer: BLUE CROSS/BLUE SHIELD | Admitting: Certified Nurse Midwife

## 2017-04-12 ENCOUNTER — Ambulatory Visit (INDEPENDENT_AMBULATORY_CARE_PROVIDER_SITE_OTHER): Payer: 59 | Admitting: Certified Nurse Midwife

## 2017-04-12 ENCOUNTER — Encounter: Payer: Self-pay | Admitting: Certified Nurse Midwife

## 2017-04-12 VITALS — BP 120/80 | HR 70 | Resp 16 | Ht 66.25 in | Wt 220.0 lb

## 2017-04-12 DIAGNOSIS — N912 Amenorrhea, unspecified: Secondary | ICD-10-CM

## 2017-04-12 DIAGNOSIS — Z23 Encounter for immunization: Secondary | ICD-10-CM | POA: Diagnosis not present

## 2017-04-12 DIAGNOSIS — Z01419 Encounter for gynecological examination (general) (routine) without abnormal findings: Secondary | ICD-10-CM | POA: Diagnosis not present

## 2017-04-12 LAB — POCT URINE PREGNANCY: Preg Test, Ur: NEGATIVE

## 2017-04-12 NOTE — Patient Instructions (Signed)
General topics  Next pap or exam is  due in 1 year Take a Women's multivitamin Take 1200 mg. of calcium daily - prefer dietary If any concerns in interim to call back  Breast Self-Awareness Practicing breast self-awareness may pick up problems early, prevent significant medical complications, and possibly save your life. By practicing breast self-awareness, you can become familiar with how your breasts look and feel and if your breasts are changing. This allows you to notice changes early. It can also offer you some reassurance that your breast health is good. One way to learn what is normal for your breasts and whether your breasts are changing is to do a breast self-exam. If you find a lump or something that was not present in the past, it is best to contact your caregiver right away. Other findings that should be evaluated by your caregiver include nipple discharge, especially if it is bloody; skin changes or reddening; areas where the skin seems to be pulled in (retracted); or new lumps and bumps. Breast pain is seldom associated with cancer (malignancy), but should also be evaluated by a caregiver. BREAST SELF-EXAM The best time to examine your breasts is 5 7 days after your menstrual period is over.  ExitCare Patient Information 2013 [employer].   Exercise to Stay Healthy Exercise helps you become and stay healthy. EXERCISE IDEAS AND TIPS Choose exercises that:  You enjoy.  Fit into your day. You do not need to exercise really hard to be healthy. You can do exercises at a slow or medium level and stay healthy. You can:  Stretch before and after working out.  Try yoga, Pilates, or tai chi.  Lift weights.  Walk fast, swim, jog, run, climb stairs, bicycle, dance, or rollerskate.  Take aerobic classes. Exercises that burn about 150 calories:  Running 1  miles in 15 minutes.  Playing volleyball for 45 to 60 minutes.  Washing and waxing a car for 45 to 60  minutes.  Playing touch football for 45 minutes.  Walking 1  miles in 35 minutes.  Pushing a stroller 1  miles in 30 minutes.  Playing basketball for 30 minutes.  Raking leaves for 30 minutes.  Bicycling 5 miles in 30 minutes.  Walking 2 miles in 30 minutes.  Dancing for 30 minutes.  Shoveling snow for 15 minutes.  Swimming laps for 20 minutes.  Walking up stairs for 15 minutes.  Bicycling 4 miles in 15 minutes.  Gardening for 30 to 45 minutes.  Jumping rope for 15 minutes.  Washing windows or floors for 45 to 60 minutes. Document Released: 12/01/2010 Document Revised: 01/21/2012 Document Reviewed: 12/01/2010 ExitCare Patient Information 2013 [employer].   Other topics ( that may be useful information):    Sexually Transmitted Disease Sexually transmitted disease (STD) refers to any infection that is passed from person to person during sexual activity. This may happen by way of saliva, semen, blood, vaginal mucus, or urine. Common STDs include:  Gonorrhea.  Chlamydia.  Syphilis.  HIV/AIDS.  Genital herpes.  Hepatitis B and C.  Trichomonas.  Human papillomavirus (HPV).  Pubic lice. CAUSES  An STD may be spread by bacteria, virus, or parasite. A person can get an STD by:  Sexual intercourse with an infected person.  Sharing sex toys with an infected person.  Sharing needles with an infected person.  Having intimate contact with the genitals, mouth, or rectal areas of an infected person. SYMPTOMS  Some people may not have any symptoms, but   they can still pass the infection to others. Different STDs have different symptoms. Symptoms include:  Painful or bloody urination.  Pain in the pelvis, abdomen, vagina, anus, throat, or eyes.  Skin rash, itching, irritation, growths, or sores (lesions). These usually occur in the genital or anal area.  Abnormal vaginal discharge.  Penile discharge in men.  Soft, flesh-colored skin growths in the  genital or anal area.  Fever.  Pain or bleeding during sexual intercourse.  Swollen glands in the groin area.  Yellow skin and eyes (jaundice). This is seen with hepatitis. DIAGNOSIS  To make a diagnosis, your caregiver may:  Take a medical history.  Perform a physical exam.  Take a specimen (culture) to be examined.  Examine a sample of discharge under a microscope.  Perform blood test TREATMENT   Chlamydia, gonorrhea, trichomonas, and syphilis can be cured with antibiotic medicine.  Genital herpes, hepatitis, and HIV can be treated, but not cured, with prescribed medicines. The medicines will lessen the symptoms.  Genital warts from HPV can be treated with medicine or by freezing, burning (electrocautery), or surgery. Warts may come back.  HPV is a virus and cannot be cured with medicine or surgery.However, abnormal areas may be followed very closely by your caregiver and may be removed from the cervix, vagina, or vulva through office procedures or surgery. If your diagnosis is confirmed, your recent sexual partners need treatment. This is true even if they are symptom-free or have a negative culture or evaluation. They should not have sex until their caregiver says it is okay. HOME CARE INSTRUCTIONS  All sexual partners should be informed, tested, and treated for all STDs.  Take your antibiotics as directed. Finish them even if you start to feel better.  Only take over-the-counter or prescription medicines for pain, discomfort, or fever as directed by your caregiver.  Rest.  Eat a balanced diet and drink enough fluids to keep your urine clear or pale yellow.  Do not have sex until treatment is completed and you have followed up with your caregiver. STDs should be checked after treatment.  Keep all follow-up appointments, Pap tests, and blood tests as directed by your caregiver.  Only use latex condoms and water-soluble lubricants during sexual activity. Do not use  petroleum jelly or oils.  Avoid alcohol and illegal drugs.  Get vaccinated for HPV and hepatitis. If you have not received these vaccines in the past, talk to your caregiver about whether one or both might be right for you.  Avoid risky sex practices that can break the skin. The only way to avoid getting an STD is to avoid all sexual activity.Latex condoms and dental dams (for oral sex) will help lessen the risk of getting an STD, but will not completely eliminate the risk. SEEK MEDICAL CARE IF:   You have a fever.  You have any new or worsening symptoms. Document Released: 01/19/2003 Document Revised: 01/21/2012 Document Reviewed: 01/26/2011 [facility] Patient Information 2013 [employer].    Domestic Abuse You are being battered or abused if someone close to you hits, pushes, or physically hurts you in any way. You also are being abused if you are forced into activities. You are being sexually abused if you are forced to have sexual contact of any kind. You are being emotionally abused if you are made to feel worthless or if you are constantly threatened. It is important to remember that help is available. No one has the right to abuse you. PREVENTION OF FURTHER  ABUSE  Learn the warning signs of danger. This varies with situations but may include: the use of alcohol, threats, isolation from friends and family, or forced sexual contact. Leave if you feel that violence is going to occur.  If you are attacked or beaten, report it to the police so the abuse is documented. You do not have to press charges. The police can protect you while you or the attackers are leaving. Get the officer's name and badge number and a copy of the report.  Find someone you can trust and tell them what is happening to you: your caregiver, a nurse, clergy member, close friend or family member. Feeling ashamed is natural, but remember that you have done nothing wrong. No one deserves abuse. Document Released:  10/26/2000 Document Revised: 01/21/2012 Document Reviewed: 01/04/2011 ExitCare Patient Information 2013 [employer].    How Much is Too Much Alcohol? Drinking too much alcohol can cause injury, accidents, and health problems. These types of problems can include:   Car crashes.  Falls.  Family fighting (domestic violence).  Drowning.  Fights.  Injuries.  Burns.  Damage to certain organs.  Having a baby with birth defects. ONE DRINK CAN BE TOO MUCH WHEN YOU ARE:  Working.  Pregnant or breastfeeding.  Taking medicines. Ask your doctor.  Driving or planning to drive. If you or someone you know has a drinking problem, get help from a doctor.  Document Released: 08/25/2009 Document Revised: 01/21/2012 Document Reviewed: 08/25/2009 ExitCare Patient Information 2013 [employer].   Smoking Hazards Smoking cigarettes is extremely bad for your health. Tobacco smoke has over 200 known poisons in it. There are over 60 chemicals in tobacco smoke that cause cancer. Some of the chemicals found in cigarette smoke include:   Cyanide.  Benzene.  Formaldehyde.  Methanol (wood alcohol).  Acetylene (fuel used in welding torches).  Ammonia. Cigarette smoke also contains the poisonous gases nitrogen oxide and carbon monoxide.  Cigarette smokers have an increased risk of many serious medical problems and Smoking causes approximately:  90% of all lung cancer deaths in men.  80% of all lung cancer deaths in women.  90% of deaths from chronic obstructive lung disease. Compared with nonsmokers, smoking increases the risk of:  Coronary heart disease by 2 to 4 times.  Stroke by 2 to 4 times.  Men developing lung cancer by 23 times.  Women developing lung cancer by 13 times.  Dying from chronic obstructive lung diseases by 12 times.  . Smoking is the most preventable cause of death and disease in our society.  WHY IS SMOKING ADDICTIVE?  Nicotine is the chemical  agent in tobacco that is capable of causing addiction or dependence.  When you smoke and inhale, nicotine is absorbed rapidly into the bloodstream through your lungs. Nicotine absorbed through the lungs is capable of creating a powerful addiction. Both inhaled and non-inhaled nicotine may be addictive.  Addiction studies of cigarettes and spit tobacco show that addiction to nicotine occurs mainly during the teen years, when young people begin using tobacco products. WHAT ARE THE BENEFITS OF QUITTING?  There are many health benefits to quitting smoking.   Likelihood of developing cancer and heart disease decreases. Health improvements are seen almost immediately.  Blood pressure, pulse rate, and breathing patterns start returning to normal soon after quitting. QUITTING SMOKING   American Lung Association - 1-800-LUNGUSA  American Cancer Society - 1-800-ACS-2345 Document Released: 12/06/2004 Document Revised: 01/21/2012 Document Reviewed: 08/10/2009 ExitCare Patient Information 2013 ExitCare,   LLC.   Stress Management Stress is a state of physical or mental tension that often results from changes in your life or normal routine. Some common causes of stress are:  Death of a loved one.  Injuries or severe illnesses.  Getting fired or changing jobs.  Moving into a new home. Other causes may be:  Sexual problems.  Business or financial losses.  Taking on a large debt.  Regular conflict with someone at home or at work.  Constant tiredness from lack of sleep. It is not just bad things that are stressful. It may be stressful to:  Win the lottery.  Get married.  Buy a new car. The amount of stress that can be easily tolerated varies from person to person. Changes generally cause stress, regardless of the types of change. Too much stress can affect your health. It may lead to physical or emotional problems. Too little stress (boredom) may also become stressful. SUGGESTIONS TO  REDUCE STRESS:  Talk things over with your family and friends. It often is helpful to share your concerns and worries. If you feel your problem is serious, you may want to get help from a professional counselor.  Consider your problems one at a time instead of lumping them all together. Trying to take care of everything at once may seem impossible. List all the things you need to do and then start with the most important one. Set a goal to accomplish 2 or 3 things each day. If you expect to do too many in a single day you will naturally fail, causing you to feel even more stressed.  Do not use alcohol or drugs to relieve stress. Although you may feel better for a short time, they do not remove the problems that caused the stress. They can also be habit forming.  Exercise regularly - at least 3 times per week. Physical exercise can help to relieve that "uptight" feeling and will relax you.  The shortest distance between despair and hope is often a good night's sleep.  Go to bed and get up on time allowing yourself time for appointments without being rushed.  Take a short "time-out" period from any stressful situation that occurs during the day. Close your eyes and take some deep breaths. Starting with the muscles in your face, tense them, hold it for a few seconds, then relax. Repeat this with the muscles in your neck, shoulders, hand, stomach, back and legs.  Take good care of yourself. Eat a balanced diet and get plenty of rest.  Schedule time for having fun. Take a break from your daily routine to relax. HOME CARE INSTRUCTIONS   Call if you feel overwhelmed by your problems and feel you can no longer manage them on your own.  Return immediately if you feel like hurting yourself or someone else. Document Released: 04/24/2001 Document Revised: 01/21/2012 Document Reviewed: 12/15/2007 ExitCare Patient Information 2013 ExitCare, [employer].   

## 2017-04-12 NOTE — Progress Notes (Signed)
26 y.o. G0P0000 Single  African American Fe here for annual exam. Periods normal, occasional late. Not sexually active ever. Sees Urgent care if needed. No health issues over the past year. Getting ready to move in her own home. Aware she has gained weight living at home and will do better on her own. Planning to go back for another degree!  Patient's last menstrual period was 03/09/2017 (exact date).          Sexually active: No.  The current method of family planning is abstinence.    Exercising: No.  exercise Smoker:  no  Health Maintenance: Pap:   04-15-15 neg History of Abnormal Pap: no MMG:  none Self Breast exams: occ Colonoscopy:  none BMD:   none TDaP:  2008 Shingles: no Pneumonia: no Hep C and HIV: not done Labs: poct upt-neg   reports that she has never smoked. She has never used smokeless tobacco. She reports that she does not drink alcohol or use drugs.  Past Medical History:  Diagnosis Date  . Enlarged thyroid     Past Surgical History:  Procedure Laterality Date  . BREAST REDUCTION SURGERY  11-08-15  . WISDOM TOOTH EXTRACTION  2008    No current outpatient prescriptions on file.   No current facility-administered medications for this visit.     Family History  Problem Relation Age of Onset  . Breast cancer Maternal Grandmother [id number]  . Hypertension Mother   . High Cholesterol Father   . Thyroid disease Neg Hx     ROS:  Pertinent items are noted in HPI.  Otherwise, a comprehensive ROS was negative.  Exam:   BP 120/80   Pulse 70   Resp 16   Ht 5' 6.25" (1.683 m)   Wt 220 lb (99.8 kg)   LMP 03/09/2017 (Exact Date)   BMI 35.24 kg/m  Height: 5' 6.25" (168.3 cm) Ht Readings from Last 3 Encounters:  04/12/17 5' 6.25" (1.683 m)  03/27/16 5' 5.25" (1.657 m)  05/26/15 5' 5.75" (1.67 m)    General appearance: alert, cooperative and appears stated age Head: Normocephalic, without obvious abnormality, atraumatic Neck: no adenopathy, supple, symmetrical,  trachea midline and thyroid normal to inspection and palpation Lungs: clear to auscultation bilaterally Breasts: normal appearance, no masses or tenderness, No nipple retraction or dimpling, No nipple discharge or bleeding, No axillary or supraclavicular adenopathy Heart: regular rate and rhythm Abdomen: soft, non-tender; no masses,  no organomegaly Extremities: extremities normal, atraumatic, no cyanosis or edema Skin: Skin color, texture, turgor normal. No rashes or lesions Lymph nodes: Cervical, supraclavicular, and axillary nodes normal. No abnormal inguinal nodes palpated Neurologic: Grossly normal   Pelvic: External genitalia:  no lesions              Urethra:  normal appearing urethra with no masses, tenderness or lesions              Bartholin's and Skene's: normal                 Vagina: normal appearing vagina with normal color and discharge, no lesions              Cervix: no cervical motion tenderness, no lesions and nulliparous appearance              Pap taken: Yes.   Bimanual Exam:  Uterus:  normal size, contour, position, consistency, mobility, non-tender              Adnexa: normal adnexa and  no mass, fullness, tenderness               Rectovaginal: Confirms               Anus:  normal sphincter tone, no lesions  Chaperone present: yes  A:  Well Woman with normal exam  Contraception abstinence, never sexually active ever  Morbid obesity now working on weight loss  Immunization update  P:   Reviewed health and wellness pertinent to exam  Will advise if contraception needed.  Requests TDAP  Pap smear: no   counseled on breast self exam, STD prevention, HIV risk factors and prevention, adequate intake of calcium and vitamin D, diet and exercise  return annually or prn  An After Visit Summary was printed and given to the patient.

## 2018-04-04 ENCOUNTER — Telehealth: Payer: Self-pay | Admitting: Certified Nurse Midwife

## 2018-04-04 NOTE — Telephone Encounter (Signed)
Left message on voicemail to call and reschedule cancelled appointment. °

## 2018-04-18 ENCOUNTER — Ambulatory Visit: Payer: 59 | Admitting: Certified Nurse Midwife

## 2018-07-11 ENCOUNTER — Encounter

## 2018-07-11 ENCOUNTER — Other Ambulatory Visit: Payer: Self-pay

## 2018-07-11 ENCOUNTER — Other Ambulatory Visit (HOSPITAL_COMMUNITY)
Admission: RE | Admit: 2018-07-11 | Discharge: 2018-07-11 | Disposition: A | Discharge status: Home / Self Care | Payer: BLUE CROSS/BLUE SHIELD | Source: Ambulatory Visit | Attending: Certified Nurse Midwife | Admitting: Certified Nurse Midwife

## 2018-07-11 ENCOUNTER — Encounter: Payer: Self-pay | Admitting: Certified Nurse Midwife

## 2018-07-11 ENCOUNTER — Ambulatory Visit (INDEPENDENT_AMBULATORY_CARE_PROVIDER_SITE_OTHER): Payer: BLUE CROSS/BLUE SHIELD | Admitting: Certified Nurse Midwife

## 2018-07-11 VITALS — BP 130/80 | HR 70 | Resp 16 | Ht 65.0 in | Wt 184.0 lb

## 2018-07-11 DIAGNOSIS — Z01419 Encounter for gynecological examination (general) (routine) without abnormal findings: Secondary | ICD-10-CM | POA: Diagnosis not present

## 2018-07-11 DIAGNOSIS — Z124 Encounter for screening for malignant neoplasm of cervix: Secondary | ICD-10-CM | POA: Diagnosis not present

## 2018-07-11 DIAGNOSIS — E042 Nontoxic multinodular goiter: Secondary | ICD-10-CM

## 2018-07-11 DIAGNOSIS — R634 Abnormal weight loss: Secondary | ICD-10-CM

## 2018-07-11 NOTE — Patient Instructions (Signed)
General topics  Next pap or exam is  due in 1 year Take a Women's multivitamin Take 1200 mg. of calcium daily - prefer dietary If any concerns in interim to call back  Breast Self-Awareness Practicing breast self-awareness may pick up problems early, prevent significant medical complications, and possibly save your life. By practicing breast self-awareness, you can become familiar with how your breasts look and feel and if your breasts are changing. This allows you to notice changes early. It can also offer you some reassurance that your breast health is good. One way to learn what is normal for your breasts and whether your breasts are changing is to do a breast self-exam. If you find a lump or something that was not present in the past, it is best to contact your caregiver right away. Other findings that should be evaluated by your caregiver include nipple discharge, especially if it is bloody; skin changes or reddening; areas where the skin seems to be pulled in (retracted); or new lumps and bumps. Breast pain is seldom associated with cancer (malignancy), but should also be evaluated by a caregiver. BREAST SELF-EXAM The best time to examine your breasts is 5 7 days after your menstrual period is over.  ExitCare Patient Information 2013 [employer].   Exercise to Stay Healthy Exercise helps you become and stay healthy. EXERCISE IDEAS AND TIPS Choose exercises that:  You enjoy.  Fit into your day. You do not need to exercise really hard to be healthy. You can do exercises at a slow or medium level and stay healthy. You can:  Stretch before and after working out.  Try yoga, Pilates, or tai chi.  Lift weights.  Walk fast, swim, jog, run, climb stairs, bicycle, dance, or rollerskate.  Take aerobic classes. Exercises that burn about 150 calories:  Running 1  miles in 15 minutes.  Playing volleyball for 45 to 60 minutes.  Washing and waxing a car for 45 to 60  minutes.  Playing touch football for 45 minutes.  Walking 1  miles in 35 minutes.  Pushing a stroller 1  miles in 30 minutes.  Playing basketball for 30 minutes.  Raking leaves for 30 minutes.  Bicycling 5 miles in 30 minutes.  Walking 2 miles in 30 minutes.  Dancing for 30 minutes.  Shoveling snow for 15 minutes.  Swimming laps for 20 minutes.  Walking up stairs for 15 minutes.  Bicycling 4 miles in 15 minutes.  Gardening for 30 to 45 minutes.  Jumping rope for 15 minutes.  Washing windows or floors for 45 to 60 minutes. Document Released: 12/01/2010 Document Revised: 01/21/2012 Document Reviewed: 12/01/2010 ExitCare Patient Information 2013 [employer].   Other topics ( that may be useful information):    Sexually Transmitted Disease Sexually transmitted disease (STD) refers to any infection that is passed from person to person during sexual activity. This may happen by way of saliva, semen, blood, vaginal mucus, or urine. Common STDs include:  Gonorrhea.  Chlamydia.  Syphilis.  HIV/AIDS.  Genital herpes.  Hepatitis B and C.  Trichomonas.  Human papillomavirus (HPV).  Pubic lice. CAUSES  An STD may be spread by bacteria, virus, or parasite. A person can get an STD by:  Sexual intercourse with an infected person.  Sharing sex toys with an infected person.  Sharing needles with an infected person.  Having intimate contact with the genitals, mouth, or rectal areas of an infected person. SYMPTOMS  Some people may not have any symptoms, but   they can still pass the infection to others. Different STDs have different symptoms. Symptoms include:  Painful or bloody urination.  Pain in the pelvis, abdomen, vagina, anus, throat, or eyes.  Skin rash, itching, irritation, growths, or sores (lesions). These usually occur in the genital or anal area.  Abnormal vaginal discharge.  Penile discharge in men.  Soft, flesh-colored skin growths in the  genital or anal area.  Fever.  Pain or bleeding during sexual intercourse.  Swollen glands in the groin area.  Yellow skin and eyes (jaundice). This is seen with hepatitis. DIAGNOSIS  To make a diagnosis, your caregiver may:  Take a medical history.  Perform a physical exam.  Take a specimen (culture) to be examined.  Examine a sample of discharge under a microscope.  Perform blood test TREATMENT   Chlamydia, gonorrhea, trichomonas, and syphilis can be cured with antibiotic medicine.  Genital herpes, hepatitis, and HIV can be treated, but not cured, with prescribed medicines. The medicines will lessen the symptoms.  Genital warts from HPV can be treated with medicine or by freezing, burning (electrocautery), or surgery. Warts may come back.  HPV is a virus and cannot be cured with medicine or surgery.However, abnormal areas may be followed very closely by your caregiver and may be removed from the cervix, vagina, or vulva through office procedures or surgery. If your diagnosis is confirmed, your recent sexual partners need treatment. This is true even if they are symptom-free or have a negative culture or evaluation. They should not have sex until their caregiver says it is okay. HOME CARE INSTRUCTIONS  All sexual partners should be informed, tested, and treated for all STDs.  Take your antibiotics as directed. Finish them even if you start to feel better.  Only take over-the-counter or prescription medicines for pain, discomfort, or fever as directed by your caregiver.  Rest.  Eat a balanced diet and drink enough fluids to keep your urine clear or pale yellow.  Do not have sex until treatment is completed and you have followed up with your caregiver. STDs should be checked after treatment.  Keep all follow-up appointments, Pap tests, and blood tests as directed by your caregiver.  Only use latex condoms and water-soluble lubricants during sexual activity. Do not use  petroleum jelly or oils.  Avoid alcohol and illegal drugs.  Get vaccinated for HPV and hepatitis. If you have not received these vaccines in the past, talk to your caregiver about whether one or both might be right for you.  Avoid risky sex practices that can break the skin. The only way to avoid getting an STD is to avoid all sexual activity.Latex condoms and dental dams (for oral sex) will help lessen the risk of getting an STD, but will not completely eliminate the risk. SEEK MEDICAL CARE IF:   You have a fever.  You have any new or worsening symptoms. Document Released: 01/19/2003 Document Revised: 01/21/2012 Document Reviewed: 01/26/2011 [facility] Patient Information 2013 [employer].    Domestic Abuse You are being battered or abused if someone close to you hits, pushes, or physically hurts you in any way. You also are being abused if you are forced into activities. You are being sexually abused if you are forced to have sexual contact of any kind. You are being emotionally abused if you are made to feel worthless or if you are constantly threatened. It is important to remember that help is available. No one has the right to abuse you. PREVENTION OF FURTHER  ABUSE  Learn the warning signs of danger. This varies with situations but may include: the use of alcohol, threats, isolation from friends and family, or forced sexual contact. Leave if you feel that violence is going to occur.  If you are attacked or beaten, report it to the police so the abuse is documented. You do not have to press charges. The police can protect you while you or the attackers are leaving. Get the officer's name and badge number and a copy of the report.  Find someone you can trust and tell them what is happening to you: your caregiver, a nurse, clergy member, close friend or family member. Feeling ashamed is natural, but remember that you have done nothing wrong. No one deserves abuse. Document Released:  10/26/2000 Document Revised: 01/21/2012 Document Reviewed: 01/04/2011 ExitCare Patient Information 2013 [employer].    How Much is Too Much Alcohol? Drinking too much alcohol can cause injury, accidents, and health problems. These types of problems can include:   Car crashes.  Falls.  Family fighting (domestic violence).  Drowning.  Fights.  Injuries.  Burns.  Damage to certain organs.  Having a baby with birth defects. ONE DRINK CAN BE TOO MUCH WHEN YOU ARE:  Working.  Pregnant or breastfeeding.  Taking medicines. Ask your doctor.  Driving or planning to drive. If you or someone you know has a drinking problem, get help from a doctor.  Document Released: 08/25/2009 Document Revised: 01/21/2012 Document Reviewed: 08/25/2009 ExitCare Patient Information 2013 [employer].   Smoking Hazards Smoking cigarettes is extremely bad for your health. Tobacco smoke has over 200 known poisons in it. There are over 60 chemicals in tobacco smoke that cause cancer. Some of the chemicals found in cigarette smoke include:   Cyanide.  Benzene.  Formaldehyde.  Methanol (wood alcohol).  Acetylene (fuel used in welding torches).  Ammonia. Cigarette smoke also contains the poisonous gases nitrogen oxide and carbon monoxide.  Cigarette smokers have an increased risk of many serious medical problems and Smoking causes approximately:  90% of all lung cancer deaths in men.  80% of all lung cancer deaths in women.  90% of deaths from chronic obstructive lung disease. Compared with nonsmokers, smoking increases the risk of:  Coronary heart disease by 2 to 4 times.  Stroke by 2 to 4 times.  Men developing lung cancer by 23 times.  Women developing lung cancer by 13 times.  Dying from chronic obstructive lung diseases by 12 times.  . Smoking is the most preventable cause of death and disease in our society.  WHY IS SMOKING ADDICTIVE?  Nicotine is the chemical  agent in tobacco that is capable of causing addiction or dependence.  When you smoke and inhale, nicotine is absorbed rapidly into the bloodstream through your lungs. Nicotine absorbed through the lungs is capable of creating a powerful addiction. Both inhaled and non-inhaled nicotine may be addictive.  Addiction studies of cigarettes and spit tobacco show that addiction to nicotine occurs mainly during the teen years, when young people begin using tobacco products. WHAT ARE THE BENEFITS OF QUITTING?  There are many health benefits to quitting smoking.   Likelihood of developing cancer and heart disease decreases. Health improvements are seen almost immediately.  Blood pressure, pulse rate, and breathing patterns start returning to normal soon after quitting. QUITTING SMOKING   American Lung Association - 1-800-LUNGUSA  American Cancer Society - 1-800-ACS-2345 Document Released: 12/06/2004 Document Revised: 01/21/2012 Document Reviewed: 08/10/2009 ExitCare Patient Information 2013 ExitCare,   LLC.   Stress Management Stress is a state of physical or mental tension that often results from changes in your life or normal routine. Some common causes of stress are:  Death of a loved one.  Injuries or severe illnesses.  Getting fired or changing jobs.  Moving into a new home. Other causes may be:  Sexual problems.  Business or financial losses.  Taking on a large debt.  Regular conflict with someone at home or at work.  Constant tiredness from lack of sleep. It is not just bad things that are stressful. It may be stressful to:  Win the lottery.  Get married.  Buy a new car. The amount of stress that can be easily tolerated varies from person to person. Changes generally cause stress, regardless of the types of change. Too much stress can affect your health. It may lead to physical or emotional problems. Too little stress (boredom) may also become stressful. SUGGESTIONS TO  REDUCE STRESS:  Talk things over with your family and friends. It often is helpful to share your concerns and worries. If you feel your problem is serious, you may want to get help from a professional counselor.  Consider your problems one at a time instead of lumping them all together. Trying to take care of everything at once may seem impossible. List all the things you need to do and then start with the most important one. Set a goal to accomplish 2 or 3 things each day. If you expect to do too many in a single day you will naturally fail, causing you to feel even more stressed.  Do not use alcohol or drugs to relieve stress. Although you may feel better for a short time, they do not remove the problems that caused the stress. They can also be habit forming.  Exercise regularly - at least 3 times per week. Physical exercise can help to relieve that "uptight" feeling and will relax you.  The shortest distance between despair and hope is often a good night's sleep.  Go to bed and get up on time allowing yourself time for appointments without being rushed.  Take a short "time-out" period from any stressful situation that occurs during the day. Close your eyes and take some deep breaths. Starting with the muscles in your face, tense them, hold it for a few seconds, then relax. Repeat this with the muscles in your neck, shoulders, hand, stomach, back and legs.  Take good care of yourself. Eat a balanced diet and get plenty of rest.  Schedule time for having fun. Take a break from your daily routine to relax. HOME CARE INSTRUCTIONS   Call if you feel overwhelmed by your problems and feel you can no longer manage them on your own.  Return immediately if you feel like hurting yourself or someone else. Document Released: 04/24/2001 Document Revised: 01/21/2012 Document Reviewed: 12/15/2007 ExitCare Patient Information 2013 ExitCare, [employer].   

## 2018-07-11 NOTE — Progress Notes (Signed)
27 y.o. G0P0000 Single  African American Fe here for annual exam. Periods normal, no issues, regular slightly more cramping using OTC without problems. Not sexually active. Has worked hard at losing 40 pounds and continues to work on and exercise! No health concerns today.  Patient's last menstrual period was 07/08/2018 (exact date).          Sexually active: No.  The current method of family planning is abstinence.    Exercising: Yes.    cardio, strength training Smoker:  no  Review of Systems  Constitutional: Negative.   HENT:       Headache  Eyes: Negative.   Respiratory: Negative.   Cardiovascular: Negative.   Gastrointestinal: Negative.   Genitourinary: Negative.   Musculoskeletal: Negative.   Skin: Positive for rash.  Neurological: Negative.   Endo/Heme/Allergies: Negative.   Psychiatric/Behavioral: Negative.     Health Maintenance: Pap:  04-15-15 neg History of Abnormal Pap: no MMG:  none Self Breast exams: no Colonoscopy:  none BMD:   none TDaP:  2018 Shingles: no Pneumonia: no Hep C and HIV: not done Labs: if needed   reports that she has never smoked. She has never used smokeless tobacco. She reports that she does not drink alcohol or use drugs.  Past Medical History:  Diagnosis Date  . Enlarged thyroid     Past Surgical History:  Procedure Laterality Date  . BREAST REDUCTION SURGERY  11-08-15  . WISDOM TOOTH EXTRACTION  2008    Current Outpatient Medications  Medication Sig Dispense Refill  . IRON PO Take by mouth.    . Multiple Vitamins-Minerals (MULTIVITAMIN PO) Take by mouth.     No current facility-administered medications for this visit.     Family History  Problem Relation Age of Onset  . Breast cancer Maternal Grandmother [id number]  . Hypertension Mother   . High Cholesterol Father   . Thyroid disease Neg Hx     ROS:  Pertinent items are noted in HPI.  Otherwise, a comprehensive ROS was negative.  Exam:   BP 130/80   Pulse 70   Resp 16    Ht 5\' 5"  (1.651 m)   Wt 184 lb (83.5 kg)   LMP 07/08/2018 (Exact Date)   BMI 30.62 kg/m  Height: 5\' 5"  (165.1 cm) Ht Readings from Last 3 Encounters:  07/11/18 5\' 5"  (1.651 m)  04/12/17 5' 6.25" (1.683 m)  03/27/16 5' 5.25" (1.657 m)    General appearance: alert, cooperative and appears stated age Head: Normocephalic, without obvious abnormality, atraumatic Neck: no adenopathy, supple, symmetrical, trachea midline and thyroid enlarged, nodular and feel size may have changed Lungs: clear to auscultation bilaterally Breasts: normal appearance, no masses or tenderness, No nipple retraction or dimpling, No nipple discharge or bleeding, No axillary or supraclavicular adenopathy, surgical scarring Heart: regular rate and rhythm Abdomen: soft, non-tender; no masses,  no organomegaly Extremities: extremities normal, atraumatic, no cyanosis or edema Skin: Skin color, texture, turgor normal. No rashes or lesions Lymph nodes: Cervical, supraclavicular, and axillary nodes normal. No abnormal inguinal nodes palpated Neurologic: Grossly normal   Pelvic: External genitalia:  no lesions              Urethra:  normal appearing urethra with no masses, tenderness or lesions              Bartholin's and Skene's: normal                 Vagina: normal appearing vagina with normal color and  discharge, no lesions              Cervix: no cervical motion tenderness, no lesions and nulliparous appearance              Pap taken: Yes.   Bimanual Exam:  Uterus:  normal size, contour, position, consistency, mobility, non-tender and anteverted              Adnexa: normal adnexa and no mass, fullness, tenderness               Rectovaginal: Confirms               Anus:  normal appearance, no lesions  Chaperone present: yes  A:  Well Woman with normal exam  Contraception none needed not sexually active  Known enlarged thyroid with nodules, size change of nodules last [place] 2016  Weight loss intentional   P:    Reviewed health and wellness pertinent to exam  Will advise if needed.  Discussed repeat US feels is needed due to change and for follow up, patient agreeable. Patient will be called with information, order in.  Lab: TSH with panel  Encouraged to continue weight loss journey  Pap smear: yes   counseled on breast self exam, STD prevention, HIV risk factors and prevention, adequate intake of calcium and vitamin D, diet and exercise  return annually or prn  An After Visit Summary was printed and given to the patient.

## 2018-07-12 LAB — THYROID PANEL WITH TSH
Free Thyroxine Index: 1.8 (ref 1.2–4.9)
T3 Uptake Ratio: 28 % (ref 24–39)
T4 TOTAL: 6.3 ug/dL (ref 4.5–12.0)
TSH: 1.75 u[IU]/mL (ref 0.450–4.500)

## 2018-07-16 LAB — CYTOLOGY - PAP
DIAGNOSIS: NEGATIVE
HPV: NOT DETECTED

## 2018-07-23 ENCOUNTER — Telehealth: Payer: Self-pay | Admitting: Certified Nurse Midwife

## 2018-07-23 NOTE — Telephone Encounter (Signed)
An order was placed on 07/11/18 to [facility] for a thyroid ultrasound. Per Zenon Mayo with [facility], their office left a voicemail message requesting a return call for scheduling on 07/12/18 and again on 07/21/18. Patient has not returned their calls to schedule.   I have also left a voicemail message, for the patient today, requesting a return call. Upon return call, I will encourage patient to contact [facility] at [phone] to schedule the recommended thyroid ultrasound   cc: Leota Sauers, CNM

## 2018-07-24 NOTE — Telephone Encounter (Signed)
We discussed follow up US at visit and she was aware she would be called Thank you!

## 2018-07-24 NOTE — Telephone Encounter (Signed)
Patient returned call. Patient acknowledges receiving messages from [facility] and states she has their phone number. Patient will call [facility] for scheduling the recommended thyroid ultrasound.  Forwarding to Leota Sauerseborah Leonard, CNM. Will close encounter

## 2018-07-25 ENCOUNTER — Encounter: Payer: Self-pay | Admitting: Certified Nurse Midwife

## 2018-07-25 ENCOUNTER — Ambulatory Visit (INDEPENDENT_AMBULATORY_CARE_PROVIDER_SITE_OTHER): Payer: BLUE CROSS/BLUE SHIELD | Admitting: Certified Nurse Midwife

## 2018-07-25 ENCOUNTER — Other Ambulatory Visit: Payer: Self-pay

## 2018-07-25 VITALS — BP 114/80 | HR 70 | Resp 16 | Ht 65.0 in | Wt 183.0 lb

## 2018-07-25 DIAGNOSIS — Z7689 Persons encountering health services in other specified circumstances: Secondary | ICD-10-CM | POA: Diagnosis not present

## 2018-07-25 NOTE — Patient Instructions (Signed)
 High-Protein and High-Calorie Diet Eating high-protein and high-calorie foods can help you to gain weight, heal after an injury, and recover after an illness or surgery. What is my plan? The specific amount of daily protein and calories you need depends on:  Your body weight.  The reason this diet is recommended for you.  Generally, a high-protein, high-calorie diet involves:  Eating 250-500 extra calories each day.  Making sure that 10-35% of your daily calories come from protein.  Talk to your health care provider about how much protein and how many calories you need each day. Follow the diet as directed by your health care provider. What do I need to know about this diet?  Ask your health care provider if you should take a nutritional supplement.  Try to eat six small meals each day instead of three large meals.  Eat a balanced diet, including one food that is high in protein at each meal.  Keep nutritious snacks handy, such as nuts, trail mixes, dried fruit, and yogurt.  If you have kidney disease or diabetes, eating too much protein may put extra stress on your kidneys. Talk to your health care provider if you have either of those conditions. What are some high-protein foods? Grains Quinoa. Bulgur wheat. Vegetables Soybeans. Peas. Meats and Other Protein Sources Beef, pork, and poultry. Fish and seafood. Eggs. Tofu. Textured vegetable protein (TVP). Peanut butter. Nuts and seeds. Dried beans. Protein powders. Dairy Whole milk. Whole-milk yogurt. Powdered milk. Cheese. Cottage Cheese. Eggnog. Beverages High-protein supplement drinks. Soy milk. Other Protein bars. The items listed above may not be a complete list of recommended foods or beverages. Contact your dietitian for more options. What are some high-calorie foods? Grains Pasta. Quick breads. Muffins. Pancakes. Ready-to-eat cereal. Vegetables Vegetables cooked in oil or butter. Fried potatoes. Fruits Dried  fruit. Fruit leather. Canned fruit in syrup. Fruit juice. Avocados. Meats and Other Protein Sources Peanut butter. Nuts and seeds. Dairy Heavy cream. Whipped cream. Cream cheese. Sour cream. Ice cream. Custard. Pudding. Beverages Meal-replacement beverages. Nutrition shakes. Fruit juice. Sugar-sweetened soft drinks. Condiments Salad dressing. Mayonnaise. Alfredo sauce. Fruit preserves or jelly. Honey. Syrup. Sweets/Desserts Cake. Cookies. Pie. Pastries. Candy bars. Chocolate. Fats and Oils Butter or margarine. Oil. Gravy. Other Meal-replacement bars. The items listed above may not be a complete list of recommended foods or beverages. Contact your dietitian for more options. What are some tips for including high-protein and high-calorie foods in my diet?  Add whole milk, half-and-half, or heavy cream to cereal, pudding, soup, or hot cocoa.  Add whole milk to instant breakfast drinks.  Add peanut butter to oatmeal or smoothies.  Add powdered milk to baked goods, smoothies, or milkshakes.  Add powdered milk, cream, or butter to mashed potatoes.  Add cheese to cooked vegetables.  Make whole-milk yogurt parfaits. Top them with granola, fruit, or nuts.  Add cottage cheese to your fruit.  Add avocados, cheese, or both to sandwiches or salads.  Add meat, poultry, or seafood to rice, pasta, casseroles, salads, and soups.  Use mayonnaise when making egg salad, chicken salad, or tuna salad.  Use peanut butter as a topping for pretzels, celery, or crackers.  Add beans to casseroles, dips, and spreads.  Add pureed beans to sauces and soups.  Replace calorie-free drinks with calorie-containing drinks, such as milk and fruit juice. This information is not intended to replace advice given to you by your health care provider. Make sure you discuss any questions you have with your   health care provider. Document Released: 10/29/2005 Document Revised: 04/05/2016 Document Reviewed:  04/13/2014 Elsevier Interactive Patient Education  2018 Elsevier Inc.  

## 2018-07-25 NOTE — Progress Notes (Signed)
Patient here for form to be filled out to appeal her BMI results with her employer for insurance. Patient has been working on weight loss and exercise for the past year. She has lost 47 pounds and her BMI is 30.60. She needs to be between 25.0 to 29.9 to have good rating. Here to discuss plan of action per recommendation on form.  O: Healthy female WDWN  A: Borderline BMI elevation  P: Discussed with patient weight management, including weight loss and regular exercise 5 days / week . Meal prep daily with protein and plant based diet.   Recheck weight in one month.  Time spent in consultation and plan 18 minutes  See copy scanned into chart

## 2018-08-22 ENCOUNTER — Telehealth: Payer: Self-pay | Admitting: Certified Nurse Midwife

## 2018-08-22 NOTE — Telephone Encounter (Signed)
Patient cancelled 4 week recheck. She is doing fine and appointment is not needed at this time.

## 2018-08-25 ENCOUNTER — Telehealth: Payer: Self-pay | Admitting: Certified Nurse Midwife

## 2018-08-25 NOTE — Telephone Encounter (Signed)
An order was placed on 07/11/18 for patient to have a thyroid ultrasound. Multiple phone messages were placed by [facility] to schedule, patient did not return their calls. I spoke with patient on 07/24/18, see phone note. Patient advised at that time she would call to schedule the recommended ultrasound. To date the patient has not scheduled. Forwarding to provider to advise on how to proceed.  Forwarding to Leota Sauers, CNM

## 2018-08-26 ENCOUNTER — Ambulatory Visit: Payer: BLUE CROSS/BLUE SHIELD | Admitting: Certified Nurse Midwife

## 2018-08-26 NOTE — Telephone Encounter (Signed)
See previous phone note from Leota Sauers, CNM. Referral has been closed.   Cc: Leota Sauers, CNM

## 2018-08-26 NOTE — Telephone Encounter (Signed)
Patient is aware she has enlarged thyroid with nodules and need for follow up if change. She has imaging before and aware of concerns . I think we can close encounter.

## 2018-12-16 ENCOUNTER — Encounter: Payer: Self-pay | Admitting: Certified Nurse Midwife

## 2018-12-16 ENCOUNTER — Ambulatory Visit: Payer: 59 | Admitting: Certified Nurse Midwife

## 2018-12-16 ENCOUNTER — Other Ambulatory Visit: Payer: Self-pay

## 2018-12-16 VITALS — BP 124/84 | HR 68 | Resp 16 | Wt 192.0 lb

## 2018-12-16 DIAGNOSIS — Z113 Encounter for screening for infections with a predominantly sexual mode of transmission: Secondary | ICD-10-CM | POA: Diagnosis not present

## 2018-12-16 DIAGNOSIS — N898 Other specified noninflammatory disorders of vagina: Secondary | ICD-10-CM | POA: Diagnosis not present

## 2018-12-16 NOTE — Progress Notes (Signed)
28 y.o. Single African American female G0P0000 here with complaint of vaginal symptoms of itching and increase odorous discharge. Describes discharge as white with "gravy thickness". New partner, and used Dr.Teal epsom bath, then symptoms occurred.. Onset of symptoms one  day ago. No STD concerns, but desires screening vaginal only. Urinary symptoms none . Contraception is condoms consistent. No other issues today.  Review of Systems  Constitutional: Negative.   HENT: Negative.   Eyes: Negative.   Respiratory: Negative.   Cardiovascular: Negative.   Gastrointestinal: Negative.   Genitourinary: Negative.   Musculoskeletal: Negative.   Skin:       Vaginal itching, discharge with odor  Neurological: Negative.   Endo/Heme/Allergies: Negative.   Psychiatric/Behavioral: Negative.     O:Healthy female WDWN Affect: normal, orientation x 3  Exam:Skin: warm and dry Abdomen:soft,non tender  Inguinal Lymph node: no enlargement or tenderness Pelvic exam: External genital: normal female, no lesions BUS: negative Vagina: watery yellow odorous discharge noted.  Gc/Chlamydia lab taken, Affirm taken Cervix: normal, non tender, no CMT Uterus: normal, non tender Adnexa:normal, non tender, no masses or fullness noted   A:Normal pelvic exam R/O vaginal infection R/O STD   P:Discussed findings of normal pelvic exam and discharge noted.  Discussed Aveeno or baking soda sitz bath for comfort and odor control.. Avoid moist clothes or pads for extended period of time. If working out in gym clothes change underwear if  Possible. Will treat as indicated by labs. Questions addressed. Lab: Affirm, GC/chlamydia   Rv prn

## 2018-12-17 ENCOUNTER — Other Ambulatory Visit: Payer: Self-pay

## 2018-12-17 LAB — VAGINITIS/VAGINOSIS, DNA PROBE
CANDIDA SPECIES: NEGATIVE
GARDNERELLA VAGINALIS: POSITIVE — AB
Trichomonas vaginosis: NEGATIVE

## 2018-12-17 MED ORDER — METRONIDAZOLE 500 MG PO TABS: 500.0000 mg | ORAL_TABLET | Freq: Two times a day (BID) | ORAL | 0 refills | Status: DC

## 2018-12-19 LAB — GC/CHLAMYDIA PROBE AMP
CHLAMYDIA, DNA PROBE: NEGATIVE
Neisseria gonorrhoeae by PCR: NEGATIVE

## 2019-07-17 ENCOUNTER — Ambulatory Visit: Payer: BLUE CROSS/BLUE SHIELD | Admitting: Certified Nurse Midwife

## 2019-07-23 ENCOUNTER — Other Ambulatory Visit: Payer: Self-pay

## 2019-07-27 ENCOUNTER — Ambulatory Visit: Payer: 59 | Admitting: Certified Nurse Midwife

## 2019-07-27 ENCOUNTER — Other Ambulatory Visit: Payer: Self-pay

## 2019-07-27 ENCOUNTER — Encounter: Payer: Self-pay | Admitting: Certified Nurse Midwife

## 2019-07-27 VITALS — BP 110/70 | HR 68 | Temp 97.1°F | Resp 16 | Ht 65.75 in | Wt 197.0 lb

## 2019-07-27 DIAGNOSIS — Z01419 Encounter for gynecological examination (general) (routine) without abnormal findings: Secondary | ICD-10-CM

## 2019-07-27 NOTE — Progress Notes (Signed)
28 y.o. G0P0000 Single  Caucasian Fe here for annual exam. Periods normal, no issues. Contraception condoms consistent most of the time. No partner change, no STD screening needed. Some weight gain, no exercise. Plans to work on diet this year. Sees Urgent care if needed. No health issues today.  Patient's last menstrual period was 07/08/2019 (exact date).          Sexually active: Yes.    The current method of family planning is condoms most of the time.    Exercising: No.  exercise Smoker:  no  Review of Systems  Constitutional: Negative.   HENT: Negative.   Eyes: Negative.   Respiratory: Negative.   Cardiovascular: Negative.   Gastrointestinal: Negative.   Genitourinary: Negative.   Musculoskeletal: Negative.   Skin: Negative.   Neurological: Negative.   Endo/Heme/Allergies: Negative.   Psychiatric/Behavioral: Negative.     Health Maintenance: Pap:  04-15-15 neg, 07-11-18 neg HPV HR neg History of Abnormal Pap: no MMG:  none Self Breast exams: occ Colonoscopy:  none BMD:   none TDaP:  2018 Shingles: no Pneumonia: no Hep C and HIV: not done Labs: no   reports that she has never smoked. She has never used smokeless tobacco. She reports that she does not drink alcohol or use drugs.  Past Medical History:  Diagnosis Date  . Enlarged thyroid     Past Surgical History:  Procedure Laterality Date  . BREAST REDUCTION SURGERY  11-08-15  . WISDOM TOOTH EXTRACTION  2008    No current outpatient medications on file.   No current facility-administered medications for this visit.     Family History  Problem Relation Age of Onset  . Breast cancer Maternal Grandmother 1366  . Hypertension Mother   . High Cholesterol Father   . Thyroid disease Neg Hx     ROS:  Pertinent items are noted in HPI.  Otherwise, a comprehensive ROS was negative.  Exam:   BP 110/70   Pulse 68   Temp (!) 97.1 F (36.2 C) (Skin)   Resp 16   Ht 5' 5.75" (1.67 m)   Wt 197 lb (89.4 kg)   LMP  07/08/2019 (Exact Date)   BMI 32.04 kg/m  Height: 5' 5.75" (167 cm) Ht Readings from Last 3 Encounters:  07/27/19 5' 5.75" (1.67 m)  07/25/18 5\' 5"  (1.651 m)  07/11/18 5\' 5"  (1.651 m)    General appearance: alert, cooperative and appears stated age Head: Normocephalic, without obvious abnormality, atraumatic Neck: no adenopathy, supple, symmetrical, trachea midline and thyroid normal to inspection and palpation Lungs: clear to auscultation bilaterally Breasts: normal appearance, no masses or tenderness, No nipple retraction or dimpling, No nipple discharge or bleeding, No axillary or supraclavicular adenopathy Heart: regular rate and rhythm Abdomen: soft, non-tender; no masses,  no organomegaly Extremities: extremities normal, atraumatic, no cyanosis or edema Skin: Skin color, texture, turgor normal. No rashes or lesions Lymph nodes: Cervical, supraclavicular, and axillary nodes normal. No abnormal inguinal nodes palpated Neurologic: Grossly normal   Pelvic: External genitalia:  no lesions              Urethra:  normal appearing urethra with no masses, tenderness or lesions              Bartholin's and Skene's: normal                 Vagina: normal appearing vagina with normal color and discharge, no lesions  Cervix: no cervical motion tenderness, no lesions and normal appearance              Pap taken: No. Bimanual Exam:  Uterus:  normal size, contour, position, consistency, mobility, non-tender and anteverted              Adnexa: normal adnexa and no mass, fullness, tenderness               Rectovaginal: Confirms               Anus:  normal sphincter tone, no lesions  Chaperone present: yes  A:  Well Woman with normal exam  Contraception condoms   Weight gain but working on good diet  P:   Reviewed health and wellness pertinent to exam  Discussed importance of condom use for contraception and STD prevention.   Encouraged to work on regular exercise and  probiotics in diet to possible help with allergies.  Pap smear: no   counseled on breast self exam, STD prevention, HIV risk factors and prevention, feminine hygiene, adequate intake of calcium and vitamin D, diet and exercise  return annually or prn  An After Visit Summary was printed and given to the patient.

## 2019-07-27 NOTE — Patient Instructions (Signed)

## 2019-09-28 ENCOUNTER — Other Ambulatory Visit: Payer: Self-pay

## 2019-09-29 ENCOUNTER — Other Ambulatory Visit: Payer: Self-pay

## 2019-09-29 ENCOUNTER — Encounter: Payer: Self-pay | Admitting: Certified Nurse Midwife

## 2019-09-29 ENCOUNTER — Ambulatory Visit (INDEPENDENT_AMBULATORY_CARE_PROVIDER_SITE_OTHER): Payer: 59 | Admitting: Certified Nurse Midwife

## 2019-09-29 VITALS — BP 116/64 | HR 68 | Temp 97.1°F | Resp 16 | Wt 203.0 lb

## 2019-09-29 DIAGNOSIS — N926 Irregular menstruation, unspecified: Secondary | ICD-10-CM | POA: Diagnosis not present

## 2019-09-29 DIAGNOSIS — Z01419 Encounter for gynecological examination (general) (routine) without abnormal findings: Secondary | ICD-10-CM

## 2019-09-29 DIAGNOSIS — N898 Other specified noninflammatory disorders of vagina: Secondary | ICD-10-CM

## 2019-09-29 NOTE — Patient Instructions (Signed)
Vaginitis  Vaginitis is irritation and swelling (inflammation) of the vagina. It happens when normal bacteria and yeast in the vagina grow too much. There are many types of this condition. Treatment will depend on the type you have. Follow these instructions at home: Lifestyle  Keep your vagina area clean and dry. ? Avoid using soap. ? Rinse the area with water.  Do not do the following until your doctor says it is okay: ? Wash and clean out the vagina (douche). ? Use tampons. ? Have sex.  Wipe from front to back after going to the bathroom.  Let air reach your vagina. ? Wear cotton underwear. ? Do not wear: ? Underwear while you sleep. ? Tight pants. ? Thong underwear. ? Underwear or nylons without a cotton panel. ? Take off any wet clothing, such as bathing suits, as soon as possible.  Use gentle, non-scented products. Do not use things that can irritate the vagina, such as fabric softeners. Avoid the following products if they are scented: ? Feminine sprays. ? Detergents. ? Tampons. ? Feminine hygiene products. ? Soaps or bubble baths.  Practice safe sex and use condoms. General instructions  Take over-the-counter and prescription medicines only as told by your doctor.  If you were prescribed an antibiotic medicine, take or use it as told by your doctor. Do not stop taking or using the antibiotic even if you start to feel better.  Keep all follow-up visits as told by your doctor. This is important. Contact a doctor if:  You have pain in your belly.  You have a fever.  Your symptoms last for more than 2-3 days. Get help right away if:  You have a fever and your symptoms get worse all of a sudden. Summary  Vaginitis is irritation and swelling of the vagina. It can happen when the normal bacteria and yeast in the vagina grow too much. There are many types.  Treatment will depend on the type you have.  Do not douche, use tampons , or have sex until your health  care provider approves. When you can return to sex, practice safe sex and use condoms. This information is not intended to replace advice given to you by your health care provider. Make sure you discuss any questions you have with your health care provider. Document Released: 01/25/2009 Document Revised: 10/11/2017 Document Reviewed: 11/20/2016 Elsevier Patient Education  2020 Elsevier Inc.  

## 2019-09-29 NOTE — Progress Notes (Signed)
28 y.o. Single African American female G0P0000 here with complaint of vaginal symptoms of increase discharge with odor 5 days ago. Describes discharge as clear an odorous. Denies itching or burning or rash or lesions. Patient had not had a period in one month and wondered if this was the issue. Working 2 jobs and has gained 6 pounds. No other health changes.  No STD concerns or screening needed. Urinary symptoms none . Contraception is condoms  Review of Systems  Constitutional: Negative.   HENT: Negative.   Eyes: Negative.   Respiratory: Negative.   Cardiovascular: Negative.   Gastrointestinal: Negative.   Genitourinary:       Vaginal discharge with odor  Musculoskeletal: Negative.   Skin: Negative.   Neurological: Negative.   Endo/Heme/Allergies: Negative.   Psychiatric/Behavioral: Negative.     O:Healthy female WDWN Affect: normal, orientation x 3  Exam:Skin: warm and dry Abdomen:soft, non tender  inguinal Lymph nodes: no enlargement or tenderness Pelvic exam: External genital: normal female BUS: negative Vagina: blood present(period appearance)  noted., Affirm taken Cervix: normal, non tender, no CMT Uterus: normal, non tender Adnexa:normal, non tender, no masses or fullness noted   A:Normal pelvic exam Contraception condoms R/O vaginal infection History of missed period   P:Discussed findings of normal pelvic exam. Discussed odor could have been vaginal ph changing due to period onset. Will treat if indicated. Discussed if continues to have irregular periods, needs to advise. This is the first time for missed period. Discussed weight change and stress can interfere with cycles. Questions addressed.  Lab: affirm  Rv prn

## 2019-09-30 LAB — VAGINITIS/VAGINOSIS, DNA PROBE
Candida Species: NEGATIVE
Gardnerella vaginalis: POSITIVE — AB
Trichomonas vaginosis: NEGATIVE

## 2019-10-02 ENCOUNTER — Other Ambulatory Visit: Payer: Self-pay

## 2019-10-02 MED ORDER — METRONIDAZOLE 500 MG PO TABS: 500.0000 mg | ORAL_TABLET | Freq: Two times a day (BID) | ORAL | 0 refills | Status: DC

## 2019-10-02 NOTE — Progress Notes (Unsigned)
me

## 2020-01-25 ENCOUNTER — Encounter: Payer: Self-pay | Admitting: Certified Nurse Midwife

## 2020-01-27 ENCOUNTER — Encounter: Payer: Self-pay | Admitting: Certified Nurse Midwife

## 2020-08-05 ENCOUNTER — Ambulatory Visit: Payer: 59 | Admitting: Certified Nurse Midwife

## 2020-09-30 ENCOUNTER — Ambulatory Visit: Payer: 59 | Admitting: Obstetrics & Gynecology

## 2022-05-13 ENCOUNTER — Other Ambulatory Visit: Payer: Self-pay

## 2022-05-13 ENCOUNTER — Emergency Department (HOSPITAL_BASED_OUTPATIENT_CLINIC_OR_DEPARTMENT_OTHER)
Admission: EM | Admit: 2022-05-13 | Discharge: 2022-05-13 | Disposition: A | Discharge status: Home / Self Care | Payer: Managed Care, Other (non HMO) | Attending: Emergency Medicine | Admitting: Emergency Medicine

## 2022-05-13 ENCOUNTER — Encounter (HOSPITAL_BASED_OUTPATIENT_CLINIC_OR_DEPARTMENT_OTHER): Payer: Self-pay | Admitting: Emergency Medicine

## 2022-05-13 DIAGNOSIS — M542 Cervicalgia: Secondary | ICD-10-CM | POA: Insufficient documentation

## 2022-05-13 DIAGNOSIS — R519 Headache, unspecified: Secondary | ICD-10-CM | POA: Diagnosis not present

## 2022-05-13 DIAGNOSIS — Y92410 Unspecified street and highway as the place of occurrence of the external cause: Secondary | ICD-10-CM | POA: Insufficient documentation

## 2022-05-13 DIAGNOSIS — M25511 Pain in right shoulder: Secondary | ICD-10-CM | POA: Insufficient documentation

## 2022-05-13 MED ORDER — IBUPROFEN 200 MG PO TABS
600.0000 mg | ORAL_TABLET | Freq: Once | ORAL | Status: AC
  Administered 2022-05-13: 600 mg via ORAL
  Filled 2022-05-13: qty 1
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
## 2022-05-13 NOTE — Discharge Instructions (Signed)
Continue taking Tylenol and Motrin as needed for pain.  Avoid strenuous activity or strenuous exercise for at least 1 week, before starting again very gently.  Return to the ER immediately if you have worsening symptoms.
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
## 2023-08-08 ENCOUNTER — Encounter (HOSPITAL_BASED_OUTPATIENT_CLINIC_OR_DEPARTMENT_OTHER): Payer: Self-pay | Admitting: Certified Nurse Midwife

## 2023-08-08 ENCOUNTER — Other Ambulatory Visit (HOSPITAL_COMMUNITY)
Admission: RE | Admit: 2023-08-08 | Discharge: 2023-08-08 | Disposition: A | Discharge status: Home / Self Care | Payer: Managed Care, Other (non HMO) | Source: Ambulatory Visit | Attending: Obstetrics & Gynecology | Admitting: Obstetrics & Gynecology

## 2023-08-08 ENCOUNTER — Ambulatory Visit (HOSPITAL_BASED_OUTPATIENT_CLINIC_OR_DEPARTMENT_OTHER): Payer: Managed Care, Other (non HMO) | Admitting: Certified Nurse Midwife

## 2023-08-08 VITALS — BP 145/95 | HR 75 | Ht 65.75 in | Wt 237.2 lb

## 2023-08-08 DIAGNOSIS — N926 Irregular menstruation, unspecified: Secondary | ICD-10-CM

## 2023-08-08 DIAGNOSIS — Z124 Encounter for screening for malignant neoplasm of cervix: Secondary | ICD-10-CM | POA: Diagnosis not present

## 2023-08-08 DIAGNOSIS — R7303 Prediabetes: Secondary | ICD-10-CM

## 2023-08-08 DIAGNOSIS — R300 Dysuria: Secondary | ICD-10-CM

## 2023-08-08 DIAGNOSIS — Z113 Encounter for screening for infections with a predominantly sexual mode of transmission: Secondary | ICD-10-CM

## 2023-08-08 DIAGNOSIS — Z6838 Body mass index (BMI) 38.0-38.9, adult: Secondary | ICD-10-CM

## 2023-08-08 DIAGNOSIS — R030 Elevated blood-pressure reading, without diagnosis of hypertension: Secondary | ICD-10-CM

## 2023-08-08 LAB — POCT URINE PREGNANCY: Preg Test, Ur: NEGATIVE

## 2023-08-08 NOTE — Progress Notes (Signed)
ANNUAL EXAM Patient name: Robin Hunt MRN [id number]  Date of birth: [birth year] Chief Complaint:   New Patient (Initial Visit)  History of Present Illness:   Robin Hunt is a 32 y.o. G0P0000  female being seen today for a routine annual exam. Sexually active, monogamous with boyfriend x one year. Reports her menses are typically regular, monthly lasting 5 days with no menorrhagia or dysmenorrhea. She skipped her September period. LMP 06/19/23. Pregnancy tests at home negative. She would be okay with +UPT. She and boyfriend are getting an apartment together soon. He is very supportive. Pt previously lived alone. Reports she works a full time job Monday-Friday during the day and 2 additional jobs. Reports life has been stressful over the past year due to working 3 jobs. She reports some "depression" related to this. She isn't interested in medication at this time. Has received some counseling through [facility]. Reports she has gained ~ 30pounds in past year, now actively trying to lose weight. Agreeable to referral to Weight Management. (Pt tried Contrave but did not tolerate it).   Current complaints: She has "lipoma" right inner thigh and would like to know if it can be removed. It is bothersome to her.  Patient's last menstrual period was 06/19/2023.   [facility] - 08/08/23 1348       Pregnancy Intention Screening   Does the patient want to become pregnant in the next year? No    Does the patient's partner want to become pregnant in the next year? No    Would the patient like to discuss contraceptive options today? Yes      Contraception Wrap Up   Current Method Female Condom            The pregnancy intention screening data noted above was reviewed. Potential methods of contraception were discussed. The patient declines contraception. Will use condoms or withdrawal if she desires to avoid pregnancy.  Last pap 2022. Results were: normal per pt but record not available. H/O  abnormal pap: no Last mammogram: n/a. Family h/o breast cancer: yes MFM Last colonoscopy: n/a.  Family h/o colorectal cancer: no     08/08/2023    1:45 PM  Depression screen PHQ 2/9  Decreased Interest 2  Down, Depressed, Hopeless 2  PHQ - 2 Score 4  Altered sleeping 3  Tired, decreased energy 3  Change in appetite 3  Feeling bad or failure about yourself  0  Trouble concentrating 3  Moving slowly or fidgety/restless 0  Suicidal thoughts 0  PHQ-9 Score 16         No data to display           Review of Systems:   Pertinent items are noted in HPI Denies any headaches, blurred vision, fatigue, shortness of breath, chest pain, abdominal pain, abnormal vaginal discharge/itching/odor/irritation, problems with periods, bowel movements, urination, or intercourse unless otherwise stated above. Pertinent History Reviewed:  Reviewed past medical,surgical, social and family history.  Reviewed problem list, medications and allergies. Physical Assessment:   Vitals:   08/08/23 1332 08/08/23 1433  BP: (!) 146/94 (!) 145/95  Pulse: 75   Weight: 237 lb 3.2 oz (107.6 kg)   Height: 5' 5.75" (1.67 m)   Body mass index is 38.58 kg/m.        Physical Examination:   General appearance - well appearing, and in no distress, BMI 38  Mental status - alert, oriented to person, place, and time  Psych:  She has  a normal mood and affect  Skin - warm and dry, normal color, no suspicious lesions noted. Lipoma present right inner thigh (no erythema noted/fleshy appearance/non-tender).   Chest - effort normal, all lung fields clear to auscultation bilaterally  Heart - normal rate and regular rhythm  Neck:  midline trachea  Breasts - scars present from prior breast reduction, breasts appear normal, no suspicious masses, no skin or nipple changes or axillary nodes  Abdomen - soft, nontender, nondistended, no masses or organomegaly  Pelvic - VULVA: normal appearing vulva with no masses, tenderness or  lesions  VAGINA: normal appearing vagina with normal color and discharge, no lesions  CERVIX: normal appearing cervix without discharge or lesions, no CMT  Thin prep pap is done with HR HPV cotesting and GC/CT/TV per pt request  UTERUS: uterus is felt to be slightly large in size, shape, consistency and nontender (hx uterine fibroids)  ADNEXA: No adnexal masses or tenderness noted.  Rectal - no ext hemorrhoids noted  Extremities:  No swelling or varicosities noted  Chaperone present for exam (Dr. Hyacinth Meeker)  Results for orders placed or performed in visit on 08/08/23 (from the past 24 hour(s))  POCT urine pregnancy   Collection Time: 08/08/23  2:40 PM  Result Value Ref Range   Preg Test, Ur Negative Negative    Assessment & Plan:  1) Well-Woman Exam -Routine pap with HPV/GC/CT/TV collected -Breast self awareness encouraged  2) Encounter for cervical cancer screening  3) Irregular Menses  - UPT Negative -Pt will monitor  4) BMI 38 -Referral placed to Medical Weight management  5) Encounter for routine STI screening -No concern for STI but pt requests routine screening  6) Uterine Fibroids -Records requested from [facility] (2022)  7) Skin tag-right inner thigh -Pt desires RTO for removal with Dr. Hyacinth Meeker  Pt will call her Primary Care Provider and schedule visit for annual wellness exam/discuss elevated blood pressures/discuss possible PreDiabetes/follow-up for Hx thyroid nodules.  Labs/procedures today:   Mammogram: @ 34yo, or sooner if problems Colonoscopy: @ 34yo, or sooner if problems  Orders Placed This Encounter  Procedures   POCT urine pregnancy    Meds: No orders of the defined types were placed in this encounter.   Follow-up: Pt desires RTO for removal of lipoma/skin tag w/ Dr. Hyacinth Meeker. Next annual GYN exam planned in 1 year.   Letta Kocher, CNM 08/08/2023 2:44 PM

## 2023-08-14 LAB — CYTOLOGY - PAP
Chlamydia: NEGATIVE
Comment: NEGATIVE
Comment: NEGATIVE
Comment: NEGATIVE
Comment: NORMAL
Diagnosis: NEGATIVE
High risk HPV: NEGATIVE
Neisseria Gonorrhea: NEGATIVE
Trichomonas: NEGATIVE

## 2023-08-16 ENCOUNTER — Encounter (HOSPITAL_BASED_OUTPATIENT_CLINIC_OR_DEPARTMENT_OTHER): Payer: Self-pay | Admitting: *Deleted

## 2023-09-17 ENCOUNTER — Other Ambulatory Visit (HOSPITAL_COMMUNITY)
Admission: RE | Admit: 2023-09-17 | Discharge: 2023-09-17 | Disposition: A | Discharge status: Home / Self Care | Payer: Managed Care, Other (non HMO) | Source: Ambulatory Visit | Attending: Obstetrics & Gynecology | Admitting: Obstetrics & Gynecology

## 2023-09-17 ENCOUNTER — Encounter (HOSPITAL_BASED_OUTPATIENT_CLINIC_OR_DEPARTMENT_OTHER): Payer: Self-pay | Admitting: Obstetrics & Gynecology

## 2023-09-17 ENCOUNTER — Ambulatory Visit (INDEPENDENT_AMBULATORY_CARE_PROVIDER_SITE_OTHER): Payer: Managed Care, Other (non HMO) | Admitting: Obstetrics & Gynecology

## 2023-09-17 VITALS — BP 140/86 | HR 82 | Ht 65.0 in | Wt 236.8 lb

## 2023-09-17 DIAGNOSIS — L918 Other hypertrophic disorders of the skin: Secondary | ICD-10-CM

## 2023-09-17 DIAGNOSIS — L989 Disorder of the skin and subcutaneous tissue, unspecified: Secondary | ICD-10-CM

## 2023-09-17 DIAGNOSIS — D1723 Benign lipomatous neoplasm of skin and subcutaneous tissue of right leg: Secondary | ICD-10-CM | POA: Diagnosis not present

## 2023-09-19 LAB — SURGICAL PATHOLOGY

## 2023-09-19 NOTE — Progress Notes (Signed)
GYNECOLOGY  VISIT  CC:   removal of thigh lesion/skin tag  HPI: 32 y.o. G0P0000 Single Black or [place] American female here for removal of lesion on inner thigh, near groin.  Bothers her with walking.  It rubs all of the time and can be tender.  Has enlarged over the years.  Lesion does not bleed or itch.  It just very bothersome.  Recently seen by Merrilee Jansky, CNM, and prior pt of Leota Sauers, CNM   Past Medical History:  Diagnosis Date   Enlarged thyroid     MEDS:   No current outpatient medications on file prior to visit.   No current facility-administered medications on file prior to visit.    ALLERGIES: Septra [sulfamethoxazole-trimethoprim]  SH:  single, non smoker  Review of Systems  Constitutional: Negative.   Genitourinary: Negative.     PHYSICAL EXAMINATION:    BP (!) 140/86 (BP Location: Right Arm, Patient Position: Sitting, Cuff Size: Large)   Pulse 82   Ht 5\' 5"  (1.651 m)   Wt 236 lb 12.8 oz (107.4 kg)   LMP 06/19/2023   BMI 39.41 kg/m     General appearance: alert, cooperative and appears stated age Lymph:  no inguinal LAD noted Skin:  Inner thigh lesion more consistent with lipoma than skin tag  Procedure:  consent obtained and time out performed.  Inner thigh cleansed with Betadine x 3.  Area instilled with 1% lidocaine, plain, 3cc's total used.  Area excised completely with sterile pick ups and scissors.  Lesion was 1.5cm in diameter.  Incision closed with figure of eight suture with #4.0 Vicryl.  Dressing applied.  Tissue sent to pathology.  Pt tolerated procedure well.  Chaperone, Ina Homes, CMA, was present for exam.  Assessment/Plan: 1. Benign skin lesion of thigh - pt will return in 10 days for suture removal.  Soap and water for cleaning.  Dressing supplies provided.  Topical neosporin advised.  Signs, symptoms of infection discussed.  Pt to call with any concerns. - Surgical pathology( [facility]/ [facility])

## 2023-09-30 ENCOUNTER — Encounter (HOSPITAL_BASED_OUTPATIENT_CLINIC_OR_DEPARTMENT_OTHER): Payer: Self-pay | Admitting: Obstetrics & Gynecology

## 2023-09-30 ENCOUNTER — Ambulatory Visit (HOSPITAL_BASED_OUTPATIENT_CLINIC_OR_DEPARTMENT_OTHER): Payer: Managed Care, Other (non HMO) | Admitting: Obstetrics & Gynecology

## 2023-09-30 VITALS — BP 130/69 | HR 68 | Ht 65.0 in | Wt 240.2 lb

## 2023-09-30 DIAGNOSIS — D171 Benign lipomatous neoplasm of skin and subcutaneous tissue of trunk: Secondary | ICD-10-CM

## 2023-09-30 DIAGNOSIS — Z4889 Encounter for other specified surgical aftercare: Secondary | ICD-10-CM

## 2023-09-30 NOTE — Progress Notes (Signed)
GYNECOLOGY  VISIT  CC:   recheck after removal of lipoma  HPI: 32 y.o. G0P0000 Single Black or [place] American female here for recheck and suture removal.  Doing well.  Pathology reviewed.  No issues.  Not having any pain.  Pleased that the lesion is finally gone.   Past Medical History:  Diagnosis Date   Enlarged thyroid     MEDS:   No current outpatient medications on file prior to visit.   No current facility-administered medications on file prior to visit.    ALLERGIES: Septra [sulfamethoxazole-trimethoprim]  SH:  single, non smoker  Review of Systems  Constitutional: Negative.   Genitourinary: Negative.     PHYSICAL EXAMINATION:    BP 130/69   Pulse 68   Ht 5\' 5"  (1.651 m)   Wt 240 lb 3.2 oz (109 kg)   LMP 06/19/2023   BMI 39.97 kg/m     Physical Exam Constitutional:      Appearance: Normal appearance.  Genitourinary:   Neurological:     Mental Status: She is alert.    Chaperone not available  Assessment/Plan: 1. Lipoma of torso -s/p removal.  Healing well. Care of incision discussed.  Mederma for scar reducing discussed as well.

## 2023-10-02 ENCOUNTER — Encounter (INDEPENDENT_AMBULATORY_CARE_PROVIDER_SITE_OTHER): Payer: Self-pay | Admitting: Adult Health

## 2023-10-02 ENCOUNTER — Ambulatory Visit (INDEPENDENT_AMBULATORY_CARE_PROVIDER_SITE_OTHER): Payer: Managed Care, Other (non HMO) | Admitting: Adult Health

## 2023-10-02 VITALS — BP 140/76 | HR 81 | Temp 97.8°F | Ht 66.0 in | Wt 236.0 lb

## 2023-10-02 DIAGNOSIS — Z0000 Encounter for general adult medical examination without abnormal findings: Secondary | ICD-10-CM

## 2023-10-02 DIAGNOSIS — Z6838 Body mass index (BMI) 38.0-38.9, adult: Secondary | ICD-10-CM | POA: Diagnosis not present

## 2023-10-02 DIAGNOSIS — E669 Obesity, unspecified: Secondary | ICD-10-CM | POA: Diagnosis not present

## 2023-10-02 DIAGNOSIS — Z0289 Encounter for other administrative examinations: Secondary | ICD-10-CM

## 2023-10-02 NOTE — Progress Notes (Signed)
Office: [phone]  /  Fax: [phone]   Initial Visit  Robin Hunt was seen in clinic today to evaluate for obesity. She is interested in losing weight to improve overall health and reduce the risk of weight related complications.  She presents today to review program treatment options, initial physical assessment, and evaluation.     She was referred by: Specialist  When asked what else they would like to accomplish? She states: Adopt healthier eating patterns and Improve quality of life  Weight history: She reports steady weight gain last 11 months  When asked how has your weight affected you? She states: Having fatigue, Having poor endurance, and Problems with eating patterns  Some associated conditions: None  Contributing factors: Consumption of processed foods, Reduced physical activity, and Eating patterns  Weight promoting medications identified: None  Current nutrition plan: None  Current level of physical activity: None  Current or previous pharmacotherapy: Buproprion  Response to medication: Had side effects so it was discontinued   Past medical history includes:   Past Medical History:  Diagnosis Date   Enlarged thyroid      Objective:   BP (!) 140/76   Pulse 81   Temp 97.8 F (36.6 C)   Ht 5\' 6"  (1.676 m)   Wt 236 lb (107 kg)   LMP 09/28/2023   SpO2 98%   BMI 38.09 kg/m  She was weighed on the bioimpedance scale: Body mass index is 38.09 kg/m.  Peak Weight:236 , Body Fat%:42.9, Visceral Fat Rating:10, Weight trend over the last 12 months: Increasing  General:  Alert, oriented and cooperative. Patient is in no acute distress.  Respiratory: Normal respiratory effort, no problems with respiration noted   Gait: able to ambulate independently  Mental Status: Normal mood and affect. Normal behavior. Normal judgment and thought content.   DIAGNOSTIC DATA REVIEWED:  BMET No results found for: "NA", "K", "CL", "CO2", "GLUCOSE", "BUN",  "CREATININE", "CALCIUM", "GFRNONAA", "GFRAA" No results found for: "HGBA1C" No results found for: "INSULIN" CBC    Component Value Date/Time   HGB 12.4 03/24/2015 1342   Iron/TIBC/Ferritin/ %Sat No results found for: "IRON", "TIBC", "FERRITIN", "IRONPCTSAT" Lipid Panel  No results found for: "CHOL", "TRIG", "HDL", "CHOLHDL", "VLDL", "LDLCALC", "LDLDIRECT" Hepatic Function Panel  No results found for: "PROT", "ALBUMIN", "AST", "ALT", "ALKPHOS", "BILITOT", "BILIDIR", "IBILI"    Component Value Date/Time   TSH 1.750 07/11/2018 1551     Assessment and Plan:   Healthcare maintenance  Obesity (BMI 30-39.9), Starting BMI 38.11  ESTABLISH WITH HWW   Obesity Treatment / Action Plan:  Patient will work on garnering support from family and friends to begin weight loss journey. Will work on eliminating or reducing the presence of highly palatable, calorie dense foods in the home. Will complete provided nutritional and psychosocial assessment questionnaire before the next appointment. Will be scheduled for indirect calorimetry to determine resting energy expenditure in a fasting state.  This will allow [place] to create a reduced calorie, high-protein meal plan to promote loss of fat mass while preserving muscle mass. Counseled on the health benefits of losing 5%-15% of total body weight. Was counseled on nutritional approaches to weight loss and benefits of reducing processed foods and consuming plant-based foods and high quality protein as part of nutritional weight management. Was counseled on pharmacotherapy and role as an adjunct in weight management.   Obesity Education Performed Today:  She was weighed on the bioimpedance scale and results were discussed and documented in the synopsis.  We discussed obesity as a disease and the importance of a more detailed evaluation of all the factors contributing to the disease.  We discussed the importance of long term lifestyle changes which  include nutrition, exercise and behavioral modifications as well as the importance of customizing this to her specific health and social needs.  We discussed the benefits of reaching a healthier weight to alleviate the symptoms of existing conditions and reduce the risks of the biomechanical, metabolic and psychological effects of obesity.  Robin Hunt appears to be in the action stage of change and states they are ready to start intensive lifestyle modifications and behavioral modifications.  30 minutes was spent today on this visit including the above counseling, pre-visit chart review, and post-visit documentation.  Reviewed by clinician on day of visit: allergies, medications, problem list, medical history, surgical history, family history, social history, and previous encounter notes pertinent to obesity diagnosis.   Robin Hunt d. Emidio Warrell, NP-C

## 2023-11-11 ENCOUNTER — Ambulatory Visit (INDEPENDENT_AMBULATORY_CARE_PROVIDER_SITE_OTHER): Payer: Managed Care, Other (non HMO) | Admitting: Family Medicine

## 2023-11-11 ENCOUNTER — Encounter (INDEPENDENT_AMBULATORY_CARE_PROVIDER_SITE_OTHER): Payer: Self-pay | Admitting: Family Medicine

## 2023-11-11 VITALS — BP 142/92 | HR 75 | Temp 98.4°F | Ht 66.0 in | Wt 242.0 lb

## 2023-11-11 DIAGNOSIS — I10 Essential (primary) hypertension: Secondary | ICD-10-CM | POA: Insufficient documentation

## 2023-11-11 DIAGNOSIS — R5383 Other fatigue: Secondary | ICD-10-CM | POA: Insufficient documentation

## 2023-11-11 DIAGNOSIS — Z1331 Encounter for screening for depression: Secondary | ICD-10-CM | POA: Diagnosis not present

## 2023-11-11 DIAGNOSIS — Z6839 Body mass index (BMI) 39.0-39.9, adult: Secondary | ICD-10-CM | POA: Diagnosis not present

## 2023-11-11 DIAGNOSIS — E669 Obesity, unspecified: Secondary | ICD-10-CM | POA: Insufficient documentation

## 2023-11-11 DIAGNOSIS — R0602 Shortness of breath: Secondary | ICD-10-CM | POA: Insufficient documentation

## 2023-11-11 DIAGNOSIS — Z6835 Body mass index (BMI) 35.0-35.9, adult: Secondary | ICD-10-CM | POA: Insufficient documentation

## 2023-11-11 DIAGNOSIS — R7303 Prediabetes: Secondary | ICD-10-CM | POA: Insufficient documentation

## 2023-11-11 DIAGNOSIS — Z6837 Body mass index (BMI) 37.0-37.9, adult: Secondary | ICD-10-CM | POA: Insufficient documentation

## 2023-11-11 DIAGNOSIS — Z6836 Body mass index (BMI) 36.0-36.9, adult: Secondary | ICD-10-CM | POA: Insufficient documentation

## 2023-11-11 NOTE — Progress Notes (Signed)
.smr  Office: [phone]  /  Fax: [phone]  WEIGHT SUMMARY AND BIOMETRICS  Anthropometric Measurements Height: 5\' 6"  (1.676 m) Weight: 242 lb (109.8 kg) BMI (Calculated): 39.08 Weight at Last Visit: N/A Weight Lost Since Last Visit: N/A Weight Gained Since Last Visit: N/A Starting Weight: 242 lb Peak Weight: 240 lb Waist Measurement : 45.5 inches   Body Composition  Body Fat %: 44.6 % Fat Mass (lbs): 108 lbs Muscle Mass (lbs): 127.4 lbs Total Body Water (lbs): 90.8 lbs Visceral Fat Rating : 11   Other Clinical Data RMR: 2189 Fasting: Yes Labs: Yes Today's Visit #: 1 Starting Date: 11/11/23 Comments: FIRST VISIT    Chief Complaint: OBESITY   History of Present Illness   The patient, 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 She reports a history of prediabetes and hypertension, with the latter being uncontrolled at the time of the visit (BP 142/92). She is not currently on any medications. The patient attributes her fatigue and shortness of breath with activity to her weight, stating that her energy levels are lower than they should be and that she becomes breathless more easily during exercise.  Over the past year, the patient has gained approximately 40 pounds and is currently at her heaviest weight. She reports having been overweight for most of her life, with significant weight gain starting in college and recently exacerbated by stress. She has previously tried Weight Watchers, losing 40 pounds and maintaining most of the weight loss until the onset of the COVID-19 pandemic. The patient attributes her success with Weight Watchers to the program's assistance in making healthier choices and the time she had to be consistent with the program.  The patient reports eating out or ordering takeout/delivery 5-6 times per week and does not prefer to cook, citing time constraints as a significant obstacle. She has a  preference for snacks and carbohydrates, such as chips, crackers, and [place] fries, and has specific dislikes for grainy textures, raisins, and beans. She describes herself as a picky eater and frequently snacks between meals. She does not wake up hungry in the middle of the night and often skips breakfast, depending on her work situation. She acknowledges that eating healthier has been financially challenging and that her worst food habits include eating out frequently and making poor food choices. She does not feel she has excessive hunger signals but admits that portion control can be challenging. She reports being able to stop eating when satisfied most of the time.  The patient notes that she tends to eat when stressed or sad and has felt judged about her weight since childhood. She sometimes feels out of control with her eating but does not exhibit signs of binge eating behaviors. She describes herself as having an "all or nothing" personality. The patient's mood and food score was positive at 12, and her Epworth sleepiness score was 6. Her indirect calorimeter test for shortness of breath with exercise showed a resting metabolic rate of 4742, higher than expected for her age, sex, height, and weight.  The patient is not currently exercising, although she wears a fitness tracker and averages about 3000 steps per day. She expresses a desire to lose weight to feel comfortable in her own skin and improve her health, with a goal weight of 175 pounds, which she hopes to achieve within a year. She is not considering bariatric surgery at this time. The patient lives alone and does the grocery  shopping. She is unsure if her family, with whom she eats meals, will join her in eating healthier. She is currently working three jobs, which she feel s is a significant contributor to her stress and lack of time for meal preparation and exercise. She has accepted a new position at [employer], which she will start in the  spring.          PHYSICAL EXAM:  Blood pressure (!) 142/92, pulse 75, temperature 98.4 F (36.9 C), height 5\' 6"  (1.676 m), weight 242 lb (109.8 kg), SpO2 98%. Body mass index is 39.06 kg/m.  DIAGNOSTIC DATA REVIEWED:  BMET No results found for: "NA", "K", "CL", "CO2", "GLUCOSE", "BUN", "CREATININE", "CALCIUM", "GFRNONAA", "GFRAA" No results found for: "HGBA1C" No results found for: "INSULIN" Lab Results  Component Value Date   TSH 1.750 07/11/2018   CBC    Component Value Date/Time   HGB 12.4 03/24/2015 1342   Iron Studies No results found for: "IRON", "TIBC", "FERRITIN", "IRONPCTSAT" Lipid Panel  No results found for: "CHOL", "TRIG", "HDL", "CHOLHDL", "VLDL", "LDLCALC", "LDLDIRECT" Hepatic Function Panel  No results found for: "PROT", "ALBUMIN", "AST", "ALT", "ALKPHOS", "BILITOT", "BILIDIR", "IBILI"    Component Value Date/Time   TSH 1.750 07/11/2018 1551   Nutritional No results found for: "VD25OH"   Assessment and Plan    Obesity with Exercise induced shortness of breath Obesity with a BMI of 39.1. Experiences fatigue and shortness of breath with activity. Desires weight loss for health improvement. Discussed structured eating plan meeting 26 nutritional criteria, emphasizing adherence over scale focus. Explained practical aspects and cost-saving potential of home-cooked meals and grocery delivery. Emphasized viewing food as medicine. - Initiate Category 3 structured eating plan meeting 26 nutritional criteria. - Order comprehensive lab workup to identify potential metabolic or hormonal contributors. - Schedule follow-up appointments on January 16th at 3:20 PM and January 29th at 4:20 PM. -Will delay formal exercise until after then nutritional component is implemented.  Prediabetes Prediabetes contributing to blood sugar fluctuations and weight loss challenges. Discussed importance of specific nutritional guidance. - Include specific nutritional guidance in  the structured eating plan. - Order lab tests to evaluate blood sugar regulation.  Hypertension Uncontrolled hypertension with a reading of 142/92 mmHg. Reports white coat syndrome with normal home readings. Discussed importance of home monitoring to differentiate between white coat and persistent hypertension. - Monitor blood pressure at home twice a week and bring readings to the next visit. - Document home blood pressure readings to differentiate between white coat and persistent hypertension.  Fatigue Chronic fatigue attributed to weight. Normal EKG ruling out cardiac causes. Importance of reviewing lab results to identify underlying causes discussed. - Review lab results at the next visit to identify underlying causes of fatigue.  General Health Maintenance Averages 3,000 steps per day. Stress-related eating and poor food choices, often fast food. Discussed benefits of grocery delivery for healthier options. - Encourage gradual increase in physical activity. - Provide education on healthier food choices and meal planning. - Discuss benefits of grocery delivery services.  Follow-up - Review lab results and eating plan adherence on January 16th. - Discuss challenges with the eating plan and make adjustments. - Evaluate blood pressure readings and address persistent hypertension.         I have personally spent 40 minutes total time today in preparation, patient care, and documentation for this visit, including the following: review of clinical lab tests; review of medical tests/procedures/services.    She was informed of the importance of  frequent follow up visits to maximize her success with intensive lifestyle modifications for her multiple health conditions.    Quillian Quince, MD

## 2023-11-13 LAB — CBC WITH DIFFERENTIAL/PLATELET
Basophils Absolute: 0 10*3/uL (ref 0.0–0.2)
Basos: 1 %
EOS (ABSOLUTE): 0.1 10*3/uL (ref 0.0–0.4)
Eos: 3 %
Hematocrit: 40 % (ref 34.0–46.6)
Hemoglobin: 12.5 g/dL (ref 11.1–15.9)
Immature Grans (Abs): 0 10*3/uL (ref 0.0–0.1)
Immature Granulocytes: 0 %
Lymphocytes Absolute: 2 10*3/uL (ref 0.7–3.1)
Lymphs: 42 %
MCH: 25.5 pg — ABNORMAL LOW (ref 26.6–33.0)
MCHC: 31.3 g/dL — ABNORMAL LOW (ref 31.5–35.7)
MCV: 82 fL (ref 79–97)
Monocytes Absolute: 0.3 10*3/uL (ref 0.1–0.9)
Monocytes: 6 %
Neutrophils Absolute: 2.3 10*3/uL (ref 1.4–7.0)
Neutrophils: 48 %
Platelets: 372 10*3/uL (ref 150–450)
RBC: 4.91 x10E6/uL (ref 3.77–5.28)
RDW: 14.7 % (ref 11.7–15.4)
WBC: 4.7 10*3/uL (ref 3.4–10.8)

## 2023-11-13 LAB — LIPID PANEL WITH LDL/HDL RATIO
Cholesterol, Total: 170 mg/dL (ref 100–199)
HDL: 46 mg/dL (ref >39)
LDL Chol Calc (NIH): 112 mg/dL — ABNORMAL HIGH (ref 0–99)
LDL/HDL Ratio: 2.4 {ratio} (ref 0.0–3.2)
Triglycerides: 61 mg/dL (ref 0–149)
VLDL Cholesterol Cal: 12 mg/dL (ref 5–40)

## 2023-11-13 LAB — CMP14+EGFR
ALT: 22 [IU]/L (ref 0–32)
AST: 21 [IU]/L (ref 0–40)
Albumin: 4 g/dL (ref 3.9–4.9)
Alkaline Phosphatase: 57 [IU]/L (ref 44–121)
BUN/Creatinine Ratio: 10 (ref 9–23)
BUN: 10 mg/dL (ref 6–20)
Bilirubin Total: <0.2 mg/dL (ref 0.0–1.2)
CO2: 22 mmol/L (ref 20–29)
Calcium: 9.3 mg/dL (ref 8.7–10.2)
Chloride: 104 mmol/L (ref 96–106)
Creatinine, Ser: 0.96 mg/dL (ref 0.57–1.00)
Globulin, Total: 3.3 g/dL (ref 1.5–4.5)
Glucose: 85 mg/dL (ref 70–99)
Potassium: 4.2 mmol/L (ref 3.5–5.2)
Sodium: 139 mmol/L (ref 134–144)
Total Protein: 7.3 g/dL (ref 6.0–8.5)
eGFR: 80 mL/min/{1.73_m2} (ref >59)

## 2023-11-13 LAB — FOLATE: Folate: 10.1 ng/mL (ref >3.0)

## 2023-11-13 LAB — T3: T3, Total: 112 ng/dL (ref 71–180)

## 2023-11-13 LAB — INSULIN, RANDOM: INSULIN: 34 u[IU]/mL — ABNORMAL HIGH (ref 2.6–24.9)

## 2023-11-13 LAB — HEMOGLOBIN A1C
Est. average glucose Bld gHb Est-mCnc: 134 mg/dL
Hgb A1c MFr Bld: 6.3 % — ABNORMAL HIGH (ref 4.8–5.6)

## 2023-11-13 LAB — MAGNESIUM: Magnesium: 1.9 mg/dL (ref 1.6–2.3)

## 2023-11-13 LAB — TSH: TSH: 3.41 u[IU]/mL (ref 0.450–4.500)

## 2023-11-13 LAB — PHOSPHORUS: Phosphorus: 3.5 mg/dL (ref 3.0–4.3)

## 2023-11-13 LAB — VITAMIN B12: Vitamin B-12: 325 pg/mL (ref 232–1245)

## 2023-11-13 LAB — T4, FREE: Free T4: 1.14 ng/dL (ref 0.82–1.77)

## 2023-11-13 LAB — VITAMIN D 25 HYDROXY (VIT D DEFICIENCY, FRACTURES): Vit D, 25-Hydroxy: 31.6 ng/mL (ref 30.0–100.0)

## 2023-11-28 ENCOUNTER — Encounter (INDEPENDENT_AMBULATORY_CARE_PROVIDER_SITE_OTHER): Payer: Self-pay | Admitting: Family Medicine

## 2023-11-28 ENCOUNTER — Ambulatory Visit (INDEPENDENT_AMBULATORY_CARE_PROVIDER_SITE_OTHER): Payer: Self-pay | Admitting: Family Medicine

## 2023-11-28 VITALS — BP 135/83 | HR 94 | Temp 99.3°F | Ht 66.0 in | Wt 234.0 lb

## 2023-11-28 DIAGNOSIS — E7849 Other hyperlipidemia: Secondary | ICD-10-CM | POA: Insufficient documentation

## 2023-11-28 DIAGNOSIS — E785 Hyperlipidemia, unspecified: Secondary | ICD-10-CM

## 2023-11-28 DIAGNOSIS — E669 Obesity, unspecified: Secondary | ICD-10-CM

## 2023-11-28 DIAGNOSIS — E559 Vitamin D deficiency, unspecified: Secondary | ICD-10-CM

## 2023-11-28 DIAGNOSIS — Z6837 Body mass index (BMI) 37.0-37.9, adult: Secondary | ICD-10-CM

## 2023-11-28 DIAGNOSIS — R7303 Prediabetes: Secondary | ICD-10-CM

## 2023-11-28 MED ORDER — VITAMIN D (ERGOCALCIFEROL) 1.25 MG (50000 UNIT) PO CAPS: 50000.0000 [IU] | ORAL_CAPSULE | ORAL | 0 refills | Status: DC

## 2023-11-28 NOTE — Progress Notes (Signed)
.smr  Office: [phone]  /  Fax: [phone]  WEIGHT SUMMARY AND BIOMETRICS  Anthropometric Measurements Height: 5\' 6"  (1.676 m) Weight: 234 lb (106.1 kg) BMI (Calculated): 37.79 Weight at Last Visit: 242 lb Weight Lost Since Last Visit: 8 lb Weight Gained Since Last Visit: 0 Starting Weight: 242 lb Total Weight Loss (lbs): 8 lb (3.629 kg) Peak Weight: 240 lb Waist Measurement : 45.5 inches   Body Composition  Body Fat %: 42.8 % Fat Mass (lbs): 100.2 lbs Muscle Mass (lbs): 127 lbs Total Body Water (lbs): 88.2 lbs Visceral Fat Rating : 10   Other Clinical Data RMR: 2189 Fasting: no Labs: no Today's Visit #: 2 Starting Date: 11/11/23    Chief Complaint: OBESITY    History of Present Illness   The patient, diagnosed with obesity, presented for a follow-up consultation to discuss her progress on a category three eating plan and to review recent lab results. Over the past two weeks, the patient reported a successful adherence to the diet plan approximately 90% of the time, resulting in an 8-pound weight loss. However, the patient has not yet incorporated exercise into her routine.  The patient described initial difficulty with the diet due to its high dairy content, which she is not accustomed to. She has been consuming boiled eggs and toast for breakfast, a sandwich and peaches for lunch, and ground [place] with either zucchini or broccoli for dinner. She also reported consuming three [place] sausages as a snack and eight ounces of protein at night, which she found to be a large quantity.  Despite the high protein intake, the patient reported experiencing hunger, particularly later in the day. She also noted that due to her busy work schedule, she often forgets to eat and may need to spread her meals out throughout the day.  Lab results revealed normal electrolyte levels, kidney and liver function, and a good cholesterol level slightly below the target of 50. Triglyceride  levels, indicative of carbohydrate intake, were within the acceptable range. However, the LDL or 'bad' cholesterol level was slightly above the target of 100. Vitamin levels showed adequate folic acid and a slightly low B12 level, which is expected to improve with the current diet.  The patient's red and white cell counts were slightly low, but not concerning due to normal hemoglobin levels. Thyroid function was within the normal range, although on the higher end compared to previous results. The patient's fasting glucose was normal, but the A1C level was closer to the diabetic range, indicating prediabetes. The patient's insulin level was significantly higher than expected, suggesting that her pancreas was working harder than it should to maintain normal blood sugar levels.  The patient has a history of participating in a clinical trial involving bupropion, which resulted in nausea and increased hunger. She expressed a preference to manage her prediabetes through diet and lifestyle changes rather than medication at this time.          PHYSICAL EXAM:  Blood pressure 135/83, pulse 94, temperature 99.3 F (37.4 C), height 5\' 6"  (1.676 m), weight 234 lb (106.1 kg), last menstrual period 10/28/2023, SpO2 99%. Body mass index is 37.77 kg/m.  DIAGNOSTIC DATA REVIEWED:  BMET    Component Value Date/Time   NA 139 11/11/2023 0936   K 4.2 11/11/2023 0936   CL 104 11/11/2023 0936   CO2 22 11/11/2023 0936   GLUCOSE 85 11/11/2023 0936   BUN 10 11/11/2023 0936   CREATININE 0.96 11/11/2023 0936   CALCIUM 9.3  11/11/2023 0936   Lab Results  Component Value Date   HGBA1C 6.3 (H) 11/11/2023   Lab Results  Component Value Date   INSULIN 34.0 (H) 11/11/2023   Lab Results  Component Value Date   TSH 3.410 11/11/2023   CBC    Component Value Date/Time   WBC 4.7 11/11/2023 0936   RBC 4.91 11/11/2023 0936   HGB 12.5 11/11/2023 0936   HGB 12.4 03/24/2015 1342   HCT 40.0 11/11/2023 0936   PLT  372 11/11/2023 0936   MCV 82 11/11/2023 0936   MCH 25.5 (L) 11/11/2023 0936   MCHC 31.3 (L) 11/11/2023 0936   RDW 14.7 11/11/2023 0936   Iron Studies No results found for: "IRON", "TIBC", "FERRITIN", "IRONPCTSAT" Lipid Panel     Component Value Date/Time   CHOL 170 11/11/2023 0936   TRIG 61 11/11/2023 0936   HDL 46 11/11/2023 0936   LDLCALC 112 (H) 11/11/2023 0936   Hepatic Function Panel     Component Value Date/Time   PROT 7.3 11/11/2023 0936   ALBUMIN 4.0 11/11/2023 0936   AST 21 11/11/2023 0936   ALT 22 11/11/2023 0936   ALKPHOS 57 11/11/2023 0936   BILITOT <0.2 11/11/2023 0936      Component Value Date/Time   TSH 3.410 11/11/2023 0936   Nutritional Lab Results  Component Value Date   VD25OH 31.6 11/11/2023     Assessment and Plan    Obesity Following category three eating plan with 90% adherence, resulting in an eight-pound weight loss over two weeks. Not currently exercising. Discussed importance of protein intake and meal distribution to manage hunger. Potential benefits of light exercise and avoiding excessive exercise were discussed. - Continue current eating plan with minor adjustments - Add 15 minutes of light exercise daily  Prediabetes Fasting glucose normal, A1c near diabetic range. Insulin level elevated at 34, indicating pancreatic overwork. Emphasized nutrition's role in managing prediabetes, with 80% of treatment being dietary. Discussed metformin's benefits and side effects; patient prefers non-medication management initially. - Continue current eating plan - Reassess in two weeks  Hyperlipidemia LDL cholesterol slightly elevated at 112, HDL cholesterol slightly below target at 46. Exercise expected to improve HDL levels. Discussed diet and exercise's role in cholesterol management. - Incorporate exercise to improve HDL levels - Monitor cholesterol levels  Vitamin D Deficiency Vitamin D level at 31, just above deficiency threshold. Explained  importance of vitamin D for bone health and energy. Discussed need for prescription vitamin D due to inefficacy of over-the-counter supplements in raising levels quickly. - Prescribe vitamin D supplement, 1 dose weekly for 3 months - Send prescription to [facility] on [address] - Recheck vitamin D levels in 3 months  General Health Maintenance Discussed importance of regular thyroid function monitoring due to family history and need for annual checks. - Check thyroid function annually  Follow-up - Schedule next appointment after December 11, 2023.         I have personally spent 40 minutes total time today in preparation, patient care, and documentation for this visit, including the following: review of clinical lab tests; review of medical tests/procedures/services.    She was informed of the importance of frequent follow up visits to maximize her success with intensive lifestyle modifications for her multiple health conditions.    Quillian Quince, MD

## 2023-12-11 ENCOUNTER — Ambulatory Visit (INDEPENDENT_AMBULATORY_CARE_PROVIDER_SITE_OTHER): Payer: Self-pay | Admitting: Family Medicine

## 2023-12-11 ENCOUNTER — Encounter (INDEPENDENT_AMBULATORY_CARE_PROVIDER_SITE_OTHER): Payer: Self-pay | Admitting: Family Medicine

## 2023-12-11 VITALS — BP 137/80 | HR 74 | Temp 98.4°F | Ht 66.0 in | Wt 232.0 lb

## 2023-12-11 DIAGNOSIS — E669 Obesity, unspecified: Secondary | ICD-10-CM

## 2023-12-11 DIAGNOSIS — Z6837 Body mass index (BMI) 37.0-37.9, adult: Secondary | ICD-10-CM

## 2023-12-11 DIAGNOSIS — E559 Vitamin D deficiency, unspecified: Secondary | ICD-10-CM

## 2023-12-11 MED ORDER — VITAMIN D (ERGOCALCIFEROL) 1.25 MG (50000 UNIT) PO CAPS: 50000.0000 [IU] | ORAL_CAPSULE | ORAL | 0 refills | Status: DC

## 2023-12-11 NOTE — Progress Notes (Signed)
.smr  Office: [phone]  /  Fax: [phone]  WEIGHT SUMMARY AND BIOMETRICS  Anthropometric Measurements Height: 5\' 6"  (1.676 m) Weight: 232 lb (105.2 kg) BMI (Calculated): 37.46 Weight at Last Visit: 234 lb Weight Lost Since Last Visit: 2 lb Weight Gained Since Last Visit: 0 Starting Weight: 242 lb Total Weight Loss (lbs): 10 lb (4.536 kg) Peak Weight: 242 lb   Body Composition  Body Fat %: 44 % Fat Mass (lbs): 102 lbs Muscle Mass (lbs): 123.4 lbs Total Body Water (lbs): 90 lbs Visceral Fat Rating : 10   Other Clinical Data Fasting: No Labs: No Today's Visit #: 3 Starting Date: 11/11/23    Chief Complaint: OBESITY   History of Present Illness   The patient presents with obesity and vitamin D deficiency.  She is actively working on her diet and weight loss, having lost two pounds over the last two weeks. She follows a category three eating plan 95% of the time. She has not yet started an exercise regimen as none has been assigned yet. Her muscle mass is starting to decrease, which is a concern for her metabolism. She is focusing on increasing her protein intake to help maintain muscle mass. She consumes more string cheese, which she believes helps with her hunger levels, and consciously consumes two string cheeses daily as a good source of protein.  Regarding her vitamin D deficiency, she received a month's worth of vitamin D supplements, she requests a refill today          PHYSICAL EXAM:  Blood pressure 137/80, pulse 74, temperature 98.4 F (36.9 C), height 5\' 6"  (1.676 m), weight 232 lb (105.2 kg), last menstrual period 10/28/2023, SpO2 98%. Body mass index is 37.45 kg/m.  DIAGNOSTIC DATA REVIEWED:  BMET    Component Value Date/Time   NA 139 11/11/2023 0936   K 4.2 11/11/2023 0936   CL 104 11/11/2023 0936   CO2 22 11/11/2023 0936   GLUCOSE 85 11/11/2023 0936   BUN 10 11/11/2023 0936   CREATININE 0.96 11/11/2023 0936   CALCIUM 9.3 11/11/2023  0936   Lab Results  Component Value Date   HGBA1C 6.3 (H) 11/11/2023   Lab Results  Component Value Date   INSULIN 34.0 (H) 11/11/2023   Lab Results  Component Value Date   TSH 3.410 11/11/2023   CBC    Component Value Date/Time   WBC 4.7 11/11/2023 0936   RBC 4.91 11/11/2023 0936   HGB 12.5 11/11/2023 0936   HGB 12.4 03/24/2015 1342   HCT 40.0 11/11/2023 0936   PLT 372 11/11/2023 0936   MCV 82 11/11/2023 0936   MCH 25.5 (L) 11/11/2023 0936   MCHC 31.3 (L) 11/11/2023 0936   RDW 14.7 11/11/2023 0936   Iron Studies No results found for: "IRON", "TIBC", "FERRITIN", "IRONPCTSAT" Lipid Panel     Component Value Date/Time   CHOL 170 11/11/2023 0936   TRIG 61 11/11/2023 0936   HDL 46 11/11/2023 0936   LDLCALC 112 (H) 11/11/2023 0936   Hepatic Function Panel     Component Value Date/Time   PROT 7.3 11/11/2023 0936   ALBUMIN 4.0 11/11/2023 0936   AST 21 11/11/2023 0936   ALT 22 11/11/2023 0936   ALKPHOS 57 11/11/2023 0936   BILITOT <0.2 11/11/2023 0936      Component Value Date/Time   TSH 3.410 11/11/2023 0936   Nutritional Lab Results  Component Value Date   VD25OH 31.6 11/11/2023     Assessment and Plan  Obesity Patient has lost two pounds over the last two weeks, adhering to the category three eating plan 95% of the time. Muscle mass is decreasing, potentially lowering metabolic rate and impeding further weight loss. Advised to incorporate strengthening exercises and ensure adequate protein intake for muscle building. Discussed benefits of using large muscle groups and performing more repetitions with less weight to avoid bulky muscle. - Incorporate strengthening exercises, such as walking uphill or using dumbbells - Focus on exercises targeting thighs, butt, and core - Use more repetitions with less weight to avoid bulky muscle - Ensure adequate protein intake to support muscle building  Vitamin D Deficiency Currently on a vitamin D regimen without  issues. Prescribed a month's worth of vitamin D and will recheck levels in three months. Explained insurance coverage for vitamin D diagnosis. - Continue current vitamin D regimen - Recheck vitamin D levels in three months  Follow-up -2-3 weeks        She was informed of the importance of frequent follow up visits to maximize her success with intensive lifestyle modifications for her multiple health conditions.    Quillian Quince, MD

## 2023-12-25 ENCOUNTER — Ambulatory Visit (INDEPENDENT_AMBULATORY_CARE_PROVIDER_SITE_OTHER): Payer: Self-pay | Admitting: Family Medicine

## 2023-12-25 ENCOUNTER — Encounter (INDEPENDENT_AMBULATORY_CARE_PROVIDER_SITE_OTHER): Payer: Self-pay | Admitting: Family Medicine

## 2023-12-25 VITALS — BP 142/86 | HR 72 | Temp 98.2°F | Ht 66.0 in | Wt 230.0 lb

## 2023-12-25 DIAGNOSIS — K5900 Constipation, unspecified: Secondary | ICD-10-CM

## 2023-12-25 DIAGNOSIS — K5909 Other constipation: Secondary | ICD-10-CM

## 2023-12-25 DIAGNOSIS — E669 Obesity, unspecified: Secondary | ICD-10-CM

## 2023-12-25 DIAGNOSIS — R7303 Prediabetes: Secondary | ICD-10-CM

## 2023-12-25 DIAGNOSIS — Z6837 Body mass index (BMI) 37.0-37.9, adult: Secondary | ICD-10-CM

## 2023-12-25 DIAGNOSIS — I10 Essential (primary) hypertension: Secondary | ICD-10-CM

## 2023-12-25 MED ORDER — CHLORTHALIDONE 25 MG PO TABS: 12.5000 mg | ORAL_TABLET | Freq: Every day | ORAL | 0 refills | Status: DC

## 2023-12-25 NOTE — Progress Notes (Signed)
.smr  Office: [phone]  /  Fax: [phone]  WEIGHT SUMMARY AND BIOMETRICS  Anthropometric Measurements Height: 5\' 6"  (1.676 m) Weight: 230 lb (104.3 kg) BMI (Calculated): 37.14 Weight at Last Visit: 232 lb Weight Lost Since Last Visit: 2 lb Weight Gained Since Last Visit: 0 Starting Weight: 242 lb Total Weight Loss (lbs): 12 lb (5.443 kg) Peak Weight: 242 lb   Body Composition  Body Fat %: 43.4 % Fat Mass (lbs): 100 lbs Muscle Mass (lbs): 123.8 lbs Total Body Water (lbs): 86.8 lbs Visceral Fat Rating : 100   Other Clinical Data Fasting: No Labs: No Today's Visit #: 4 Starting Date: 11/11/23    Chief Complaint: OBESITY    History of Present Illness   Robin Hunt is a 33 year old female who presents to discuss her progress in weight management.  She is actively engaged in weight loss through diet and exercise, having lost two pounds over the last two and a half weeks. She adheres to her category three plan 95% of the time and participates in a combination of strength and cardio exercises for thirty minutes, five times per week. She experiences increased hunger, likely due to heightened physical activity, and has increased her protein intake to eight ounces to support her exercise regimen.  She has a history of hypertension but is not currently on medication. Blood pressure readings today were 141/85 and 142/86. She is attempting to manage her blood pressure through lifestyle changes, including diet and exercise. Home blood pressure readings range from 122/71 to 138/84, indicating some prehypertensive levels.  She has a history of prediabetes and is not on medication for this condition. She is working on reducing simple carbohydrates in her diet to prevent progression to type 2 diabetes.  She experiences constipation since starting her exercise routine, which she attributes to possible dehydration. Normally having bowel movements twice a day, she noted a decrease  in frequency since Friday. She also experienced a headache, which she associates with dehydration, and is working on increasing her hydration.          PHYSICAL EXAM:  Blood pressure (!) 142/86, pulse 72, temperature 98.2 F (36.8 C), height 5\' 6"  (1.676 m), weight 230 lb (104.3 kg), last menstrual period 10/28/2023, SpO2 100%. Body mass index is 37.12 kg/m.  DIAGNOSTIC DATA REVIEWED:  BMET    Component Value Date/Time   NA 139 11/11/2023 0936   K 4.2 11/11/2023 0936   CL 104 11/11/2023 0936   CO2 22 11/11/2023 0936   GLUCOSE 85 11/11/2023 0936   BUN 10 11/11/2023 0936   CREATININE 0.96 11/11/2023 0936   CALCIUM 9.3 11/11/2023 0936   Lab Results  Component Value Date   HGBA1C 6.3 (H) 11/11/2023   Lab Results  Component Value Date   INSULIN 34.0 (H) 11/11/2023   Lab Results  Component Value Date   TSH 3.410 11/11/2023   CBC    Component Value Date/Time   WBC 4.7 11/11/2023 0936   RBC 4.91 11/11/2023 0936   HGB 12.5 11/11/2023 0936   HGB 12.4 03/24/2015 1342   HCT 40.0 11/11/2023 0936   PLT 372 11/11/2023 0936   MCV 82 11/11/2023 0936   MCH 25.5 (L) 11/11/2023 0936   MCHC 31.3 (L) 11/11/2023 0936   RDW 14.7 11/11/2023 0936   Iron Studies No results found for: "IRON", "TIBC", "FERRITIN", "IRONPCTSAT" Lipid Panel     Component Value Date/Time   CHOL 170 11/11/2023 0936   TRIG 61 11/11/2023 0936  HDL 46 11/11/2023 0936   LDLCALC 112 (H) 11/11/2023 0936   Hepatic Function Panel     Component Value Date/Time   PROT 7.3 11/11/2023 0936   ALBUMIN 4.0 11/11/2023 0936   AST 21 11/11/2023 0936   ALT 22 11/11/2023 0936   ALKPHOS 57 11/11/2023 0936   BILITOT <0.2 11/11/2023 0936      Component Value Date/Time   TSH 3.410 11/11/2023 0936   Nutritional Lab Results  Component Value Date   VD25OH 31.6 11/11/2023     Assessment and Plan    Obesity Actively working on weight loss through diet and exercise. Lost 2 pounds in the last 2.5 weeks. Follows  a category three plan 95% of the time and exercises with a combination of strength and cardio for 30 minutes, five times per week. Reports increased hunger, likely related to increased physical activity. Discussed realistic expectations for weight loss and maintaining long-term weight management. Explained that losing weight at a rate of 1 pound per week is healthy and sustainable, and rapid weight loss can lead to the body perceiving starvation, which is counterproductive. - Continue current diet and exercise regimen - Monitor weight and dietary intake - Encourage hydration to prevent dehydration and constipation - Consider strategies to manage increased hunger, such as incorporating high-protein snacks  Hypertension Blood pressure readings today were elevated at 141/85 and 142/86. Home blood pressure readings show prehypertensive levels. Managing blood pressure through diet, exercise, and weight loss. Discussed potential long-term cardiovascular risks of sustained elevated blood pressure. Recommended starting chlorthalidone to help manage blood pressure. Explained that chlorthalidone helps reduce fluid retention and lowers blood pressure slightly. Informed that initial increased urination may occur but will stabilize. - Prescribe chlorthalidone 12.5 mg daily - Monitor blood pressure at home once or twice a week - Follow up in March to assess blood pressure response to medication  Prediabetes Managing prediabetes through dietary modifications and weight loss. Working on decreasing simple carbohydrates in diet. Continued weight loss and dietary management are crucial to prevent the onset of type 2 diabetes. - Continue dietary modifications to reduce simple carbohydrates - Encourage continued weight loss and exercise  Constipation Reports recent constipation, likely related to increased physical activity and potential dehydration. Discussed the importance of adequate hydration and the physiological  changes in bowel movements during weight loss. Explained that less frequent bowel movements are normal during weight loss due to reduced residual bulk in the GI tract and increased water reabsorption in the large intestine. - Increase fluid intake - Monitor bowel movements for changes in frequency and consistency - Report any hard or painful bowel movements  General Health Maintenance Discussed general dietary and lifestyle modifications to support overall health. - Encourage balanced diet with adequate protein and hydration - Consider healthy snack options to manage hunger - Continue regular physical activity  Follow-up - Follow up on January 22, 2024 - Schedule additional follow-up in April 2025.         She was informed of the importance of frequent follow up visits to maximize her success with intensive lifestyle modifications for her multiple health conditions.    Quillian Quince, MD

## 2024-01-08 ENCOUNTER — Encounter (INDEPENDENT_AMBULATORY_CARE_PROVIDER_SITE_OTHER): Payer: Self-pay | Admitting: Family Medicine

## 2024-01-08 ENCOUNTER — Ambulatory Visit (INDEPENDENT_AMBULATORY_CARE_PROVIDER_SITE_OTHER): Payer: Self-pay | Admitting: Family Medicine

## 2024-01-08 VITALS — BP 135/82 | HR 80 | Temp 98.0°F | Ht 66.0 in | Wt 224.0 lb

## 2024-01-08 DIAGNOSIS — Z6836 Body mass index (BMI) 36.0-36.9, adult: Secondary | ICD-10-CM

## 2024-01-08 DIAGNOSIS — E669 Obesity, unspecified: Secondary | ICD-10-CM

## 2024-01-08 DIAGNOSIS — I10 Essential (primary) hypertension: Secondary | ICD-10-CM

## 2024-01-08 DIAGNOSIS — E559 Vitamin D deficiency, unspecified: Secondary | ICD-10-CM

## 2024-01-08 MED ORDER — CHLORTHALIDONE 25 MG PO TABS: 12.5000 mg | ORAL_TABLET | Freq: Every day | ORAL | 0 refills | Status: DC

## 2024-01-08 MED ORDER — VITAMIN D (ERGOCALCIFEROL) 1.25 MG (50000 UNIT) PO CAPS: 50000.0000 [IU] | ORAL_CAPSULE | ORAL | 0 refills | Status: DC

## 2024-01-08 NOTE — Progress Notes (Signed)
 .[facility]  Office: [phone]  /  Fax: [phone]  WEIGHT SUMMARY AND BIOMETRICS  Anthropometric Measurements Height: 5\' 6"  (1.676 m) Weight: 224 lb (101.6 kg) BMI (Calculated): 36.17 Weight at Last Visit: 230 lb Weight Lost Since Last Visit: 6 lb Weight Gained Since Last Visit: 0 Starting Weight: 242 lb Total Weight Loss (lbs): 18 lb (8.165 kg) Peak Weight: 242 lb   Body Composition  Body Fat %: 41.7 % Fat Mass (lbs): 93.4 lbs Muscle Mass (lbs): 124.2 lbs Total Body Water (lbs): 83.4 lbs Visceral Fat Rating : 9   Other Clinical Data Fasting: No Labs: No Today's Visit #: 5 Starting Date: 11/11/23    Chief Complaint: OBESITY    History of Present Illness        History of Present Illness The patient, with obesity and hypertension, presents for obesity treatment plan and monitoring.  She is following her category three eating plan 95% of the time and is engaging in strengthening and cardio exercises for 30 minutes six times a week. She has lost six pounds in the last two weeks. She sometimes forgets to eat fruit with lunch but ensures adequate protein intake.  She has a history of hypertension and is currently taking chlorthalidone. Her blood pressure is controlled at home, typically in the 130s/80s, although it was slightly elevated at 137/79 after exercise. She is also working on diet, exercise, and weight loss to help control her blood pressure.  She has a history of vitamin D deficiency and is on a prescription of vitamin D 50,000 IU every seven days. She reports a dry mouth, which she wonders might be related to her blood pressure medication.  She lives on the third floor of an apartment building, which limits her ability to exercise more vigorously. 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 She also recalls enjoying exercises like 'Just Dance' on the Wii in the past.        PHYSICAL EXAM:  Blood pressure 135/82,  pulse 80, temperature 98 F (36.7 C), height 5\' 6"  (1.676 m), weight 224 lb (101.6 kg), SpO2 100%. Body mass index is 36.15 kg/m.  DIAGNOSTIC DATA REVIEWED:  BMET    Component Value Date/Time   NA 139 11/11/2023 0936   K 4.2 11/11/2023 0936   CL 104 11/11/2023 0936   CO2 22 11/11/2023 0936   GLUCOSE 85 11/11/2023 0936   BUN 10 11/11/2023 0936   CREATININE 0.96 11/11/2023 0936   CALCIUM 9.3 11/11/2023 0936   Lab Results  Component Value Date   HGBA1C 6.3 (H) 11/11/2023   Lab Results  Component Value Date   INSULIN 34.0 (H) 11/11/2023   Lab Results  Component Value Date   TSH 3.410 11/11/2023   CBC    Component Value Date/Time   WBC 4.7 11/11/2023 0936   RBC 4.91 11/11/2023 0936   HGB 12.5 11/11/2023 0936   HGB 12.4 03/24/2015 1342   HCT 40.0 11/11/2023 0936   PLT 372 11/11/2023 0936   MCV 82 11/11/2023 0936   MCH 25.5 (L) 11/11/2023 0936   MCHC 31.3 (L) 11/11/2023 0936   RDW 14.7 11/11/2023 0936   Iron Studies No results found for: "IRON", "TIBC", "FERRITIN", "IRONPCTSAT" Lipid Panel     Component Value Date/Time   CHOL 170 11/11/2023 0936   TRIG 61 11/11/2023 0936   HDL 46 11/11/2023 0936   LDLCALC 112 (H) 11/11/2023 0936   Hepatic Function Panel  Component Value Date/Time   PROT 7.3 11/11/2023 0936   ALBUMIN 4.0 11/11/2023 0936   AST 21 11/11/2023 0936   ALT 22 11/11/2023 0936   ALKPHOS 57 11/11/2023 0936   BILITOT <0.2 11/11/2023 0936      Component Value Date/Time   TSH 3.410 11/11/2023 0936   Nutritional Lab Results  Component Value Date   VD25OH 31.6 11/11/2023     Assessment and Plan  Obesity She is on a category three eating plan, following it 95% of the time, and has lost six pounds in the last two weeks. She is also doing strengthening and cardio exercises for 30 minutes six times a week. Emphasized the importance of maintaining a balanced diet and exercise routine to prevent metabolic slowdown. Discussed the potential impact  of low calorie intake combined with exercise on metabolism, which could lead to metabolic slowdown if not balanced. - Continue category three eating plan - Continue exercise regimen - Encourage maintaining a balanced diet and exercise routine  Hypertension Her blood pressure is well-controlled at 135/82 mmHg on chlorthalidone. Home readings are in the 120s/70s to 80s range, with a slightly elevated reading of 137/79 mmHg post-exercise. Discussed the normal fluctuation of blood pressure during exercise and the importance of adequate hydration to prevent dry mouth, which could be related to the diuretic effect of chlorthalidone. - Continue chlorthalidone - Monitor blood pressure regularly - Encourage adequate hydration  Vitamin D deficiency She is on prescription vitamin D 50,000 IU every 7 days. No issues with current supplementation were indicated. - Continue prescription vitamin D 50,000 IU every 7 days       She was informed of the importance of frequent follow up visits to maximize her success with intensive lifestyle modifications for her multiple health conditions.    Quillian Quince, MD

## 2024-01-22 ENCOUNTER — Ambulatory Visit (INDEPENDENT_AMBULATORY_CARE_PROVIDER_SITE_OTHER): Payer: Self-pay | Admitting: Family Medicine

## 2024-01-22 ENCOUNTER — Encounter (INDEPENDENT_AMBULATORY_CARE_PROVIDER_SITE_OTHER): Payer: Self-pay | Admitting: Family Medicine

## 2024-01-22 VITALS — BP 124/80 | HR 95 | Temp 98.2°F | Ht 66.0 in | Wt 222.0 lb

## 2024-01-22 DIAGNOSIS — E559 Vitamin D deficiency, unspecified: Secondary | ICD-10-CM

## 2024-01-22 DIAGNOSIS — M62830 Muscle spasm of back: Secondary | ICD-10-CM

## 2024-01-22 DIAGNOSIS — I10 Essential (primary) hypertension: Secondary | ICD-10-CM

## 2024-01-22 DIAGNOSIS — Z6836 Body mass index (BMI) 36.0-36.9, adult: Secondary | ICD-10-CM

## 2024-01-22 DIAGNOSIS — E785 Hyperlipidemia, unspecified: Secondary | ICD-10-CM

## 2024-01-22 DIAGNOSIS — J3089 Other allergic rhinitis: Secondary | ICD-10-CM

## 2024-01-22 DIAGNOSIS — J309 Allergic rhinitis, unspecified: Secondary | ICD-10-CM | POA: Insufficient documentation

## 2024-01-22 DIAGNOSIS — S3992XA Unspecified injury of lower back, initial encounter: Secondary | ICD-10-CM

## 2024-01-22 DIAGNOSIS — E669 Obesity, unspecified: Secondary | ICD-10-CM

## 2024-01-22 DIAGNOSIS — Z6835 Body mass index (BMI) 35.0-35.9, adult: Secondary | ICD-10-CM

## 2024-01-22 DIAGNOSIS — R7303 Prediabetes: Secondary | ICD-10-CM

## 2024-01-22 MED ORDER — VITAMIN D (ERGOCALCIFEROL) 1.25 MG (50000 UNIT) PO CAPS: 50000.0000 [IU] | ORAL_CAPSULE | ORAL | 0 refills | Status: DC

## 2024-01-22 MED ORDER — CHLORTHALIDONE 25 MG PO TABS: 12.5000 mg | ORAL_TABLET | Freq: Every day | ORAL | 0 refills | Status: DC

## 2024-01-22 NOTE — Progress Notes (Signed)
 Office: [phone]  /  Fax: [phone]  WEIGHT SUMMARY AND BIOMETRICS  Anthropometric Measurements Height: 5\' 6"  (1.676 m) Weight: 222 lb (100.7 kg) BMI (Calculated): 35.85 Weight at Last Visit: 224 lb Weight Lost Since Last Visit: 2 lb Weight Gained Since Last Visit: 0 Starting Weight: 242 lb Total Weight Loss (lbs): 20 lb (9.072 kg) Peak Weight: 242 lb   Body Composition  Body Fat %: 41.3 % Fat Mass (lbs): 92 lbs Muscle Mass (lbs): 124.2 lbs Total Body Water (lbs): 84 lbs Visceral Fat Rating : 9   Other Clinical Data Fasting: No Labs: No Today's Visit #: 6 Starting Date: 11/11/23    Chief Complaint: OBESITY  History of Present Illness   She has been adhering to her category three eating plan 95% of the time and has increased her strengthening exercises to 30 minutes, six days per week. She has lost two pounds over the last three weeks while maintaining muscle mass. Her goal is to improve overall health and potentially reduce or eliminate the need for antihypertensive medications.  She has a history of hypertension and is currently taking chlorthalidone 12.5 mg daily. Her blood pressure is well controlled at 124/80 mmHg.  She has prediabetes with a recent hemoglobin A1c of 6.3%.  She has hyperlipidemia with an LDL of 112 mg/dL.  She has a history of vitamin D deficiency and is on prescription vitamin D 50,000 IU weekly.  She experiences allergic rhinitis and takes Allegra and Flonase for her symptoms. She feels nasally and sometimes experiences itchy eyes and throat.  She reports an incident of back pain after performing a circuit exercise routine, which she attributes to muscle strain. She describes the pain as severe initially but notes improvement over time.          PHYSICAL EXAM:  Blood pressure 124/80, pulse 95, temperature 98.2 F (36.8 C), height 5\' 6"  (1.676 m), weight 222 lb (100.7 kg), SpO2 98%. Body mass index is 35.83 kg/m.  DIAGNOSTIC  DATA REVIEWED:  BMET    Component Value Date/Time   NA 139 11/11/2023 0936   K 4.2 11/11/2023 0936   CL 104 11/11/2023 0936   CO2 22 11/11/2023 0936   GLUCOSE 85 11/11/2023 0936   BUN 10 11/11/2023 0936   CREATININE 0.96 11/11/2023 0936   CALCIUM 9.3 11/11/2023 0936   Lab Results  Component Value Date   HGBA1C 6.3 (H) 11/11/2023   Lab Results  Component Value Date   INSULIN 34.0 (H) 11/11/2023   Lab Results  Component Value Date   TSH 3.410 11/11/2023   CBC    Component Value Date/Time   WBC 4.7 11/11/2023 0936   RBC 4.91 11/11/2023 0936   HGB 12.5 11/11/2023 0936   HGB 12.4 03/24/2015 1342   HCT 40.0 11/11/2023 0936   PLT 372 11/11/2023 0936   MCV 82 11/11/2023 0936   MCH 25.5 (L) 11/11/2023 0936   MCHC 31.3 (L) 11/11/2023 0936   RDW 14.7 11/11/2023 0936   Iron Studies No results found for: "IRON", "TIBC", "FERRITIN", "IRONPCTSAT" Lipid Panel     Component Value Date/Time   CHOL 170 11/11/2023 0936   TRIG 61 11/11/2023 0936   HDL 46 11/11/2023 0936   LDLCALC 112 (H) 11/11/2023 0936   Hepatic Function Panel     Component Value Date/Time   PROT 7.3 11/11/2023 0936   ALBUMIN 4.0 11/11/2023 0936   AST 21 11/11/2023 0936   ALT 22 11/11/2023 0936   ALKPHOS 57 11/11/2023  7829   BILITOT <0.2 11/11/2023 0936      Component Value Date/Time   TSH 3.410 11/11/2023 0936   Nutritional Lab Results  Component Value Date   VD25OH 31.6 11/11/2023     Assessment and Plan    Obesity Actively working on weight loss through a category three eating plan and increased strengthening exercises. Successfully lost two pounds in the last three weeks, maintaining muscle mass and losing fat. Experiences increased hunger due to exercise and is advised to manage this with protein-rich snacks. Discussed the importance of maintaining protein intake and adjusting snack calories on exercise days to manage hunger. - Continue category three eating plan. - Ensure consumption of  the high end of protein range (8-10 servings). - Increase snack calories by 100 on exercise days, ensuring snacks are protein-rich. - Provide her with a 100 calorie or less protein-rich snack handout.  Prediabetes Prediabetes with a recent hemoglobin A1c of 6.3%. Not on medication, focusing on diet, exercise, and weight loss to improve condition. Upcoming lab tests will help assess improvement and determine if more aggressive treatment is needed. Discussed the importance of improvement to prevent progression to diabetes.  Hypertension Hypertension is well-controlled with chlorthalidone 12.5 mg daily. Blood pressure today is 124/80 mmHg. Working on weight loss to potentially discontinue antihypertensive medication in the future. Upcoming lab tests will help determine if discontinuation is appropriate. - Refill chlorthalidone 12.5 mg daily.  Hyperlipidemia Mildly elevated LDL of 112 mg/dL. Not on statin therapy, focusing on weight loss and dietary changes to improve cholesterol levels. Upcoming lab tests will assess improvement.  Vitamin D Deficiency Currently on prescription vitamin D 50,000 IU weekly. Due for lab recheck next month to assess vitamin D levels.  Allergic Rhinitis Experiences nasal symptoms and itchy eyes and throat, likely due to allergies. Currently takes Allegra and has been advised to add Flonase Sensimist for better symptom control. Proper administration technique was discussed to enhance effectiveness and reduce side effects. Discussed that antihistamines can contribute to weight gain, but Flonase Sensimist does not. - Add Flonase Sensimist for nasal and ocular symptoms. - Educate on proper administration technique for Flonase.  Back Muscle Strain Experienced a back muscle strain after overexerting during exercise. Reports soreness but is improving. Advised to reduce back exercises and focus on core strengthening to prevent future injuries. Demonstrated stretching exercises  to perform before and after workouts. - Reduce back exercises and focus on core strengthening. - Perform recommended stretching exercises before and after workouts.  Follow-up Upcoming lab tests to assess improvements in conditions, particularly focusing on cholesterol and prediabetes. Adjustments to treatment plan will be considered based on these results. - Schedule fasting lab tests for April 9th at 9:20 AM.      She was informed of the importance of frequent follow up visits to maximize her success with intensive lifestyle modifications for her multiple health conditions.    Quillian Quince, MD

## 2024-02-05 ENCOUNTER — Ambulatory Visit (INDEPENDENT_AMBULATORY_CARE_PROVIDER_SITE_OTHER): Payer: Self-pay | Admitting: Family Medicine

## 2024-02-05 ENCOUNTER — Encounter (INDEPENDENT_AMBULATORY_CARE_PROVIDER_SITE_OTHER): Payer: Self-pay | Admitting: Family Medicine

## 2024-02-05 VITALS — BP 133/85 | HR 88 | Temp 98.1°F | Ht 66.0 in | Wt 219.0 lb

## 2024-02-05 DIAGNOSIS — I10 Essential (primary) hypertension: Secondary | ICD-10-CM

## 2024-02-05 DIAGNOSIS — E669 Obesity, unspecified: Secondary | ICD-10-CM

## 2024-02-05 DIAGNOSIS — Z6835 Body mass index (BMI) 35.0-35.9, adult: Secondary | ICD-10-CM

## 2024-02-05 DIAGNOSIS — E559 Vitamin D deficiency, unspecified: Secondary | ICD-10-CM

## 2024-02-05 MED ORDER — CHLORTHALIDONE 25 MG PO TABS
12.5000 mg | ORAL_TABLET | Freq: Every day | ORAL | 0 refills | Status: DC
Start: 2024-02-05 — End: 2024-03-18

## 2024-02-05 MED ORDER — VITAMIN D (ERGOCALCIFEROL) 1.25 MG (50000 UNIT) PO CAPS: 50000.0000 [IU] | ORAL_CAPSULE | ORAL | 0 refills | Status: DC

## 2024-02-05 NOTE — Progress Notes (Signed)
 Office: [phone]  /  Fax: [phone]  WEIGHT SUMMARY AND BIOMETRICS  Anthropometric Measurements Height: 5\' 6"  (1.676 m) Weight: 219 lb (99.3 kg) BMI (Calculated): 35.36 Weight at Last Visit: 222 lb Weight Lost Since Last Visit: 3 lb Weight Gained Since Last Visit: 0 Starting Weight: 242 lb Total Weight Loss (lbs): 23 lb (10.4 kg) Peak Weight: 242 lb   Body Composition  Body Fat %: 41.2 % Fat Mass (lbs): 90.4 lbs Muscle Mass (lbs): 122.4 lbs Total Body Water (lbs): 84.4 lbs Visceral Fat Rating : 9   Other Clinical Data Fasting: No Labs: No Today's Visit #: 7 Starting Date: 11/11/23    Chief Complaint: OBESITY   History of Present Illness   Robin Hunt is a 33 year old female who presents to discuss her treatment plan and assess progress.  She has been adhering to her category three eating plan approximately 95% of the time and has lost three pounds in the last two weeks. She is not currently exercising due to a recent back injury, which she aggravated multiple times, but each subsequent incident was less intense. She has been focusing on stretching exercises to aid recovery. She is comfortable with eating the same foods repeatedly and has made minor adjustments to her diet, such as switching from [place] to chicken and zucchini to broccoli. Her mother recently visited and is also working on [employer].  She has a history of hypertension and is currently taking chlorthalidone 12.5 mg. Her blood pressure today is 133/85, which is an improvement from her first visit. She has experienced heart palpitations, which she attributes to stress rather than the medication, and has been managing dry mouth by drinking water and Powerade Zero.  She also has a history of vitamin D deficiency and is on prescription vitamin D 50,000 IU weekly. She reports no issues with this medication and requires a refill.  She experienced a back strain after bending over and has been  cautious with physical activity to prevent further injury. She is performing stretching exercises to aid recovery.  In terms of social history, she is experiencing stress related to her current job, which she feels is not a good fit. 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 She has been at her current job since 2018 and recently made a lateral move that has not been satisfactory.          PHYSICAL EXAM:  Blood pressure 133/85, pulse 88, temperature 98.1 F (36.7 C), height 5\' 6"  (1.676 m), weight 219 lb (99.3 kg), SpO2 99%. Body mass index is 35.35 kg/m.  DIAGNOSTIC DATA REVIEWED:  BMET    Component Value Date/Time   NA 139 11/11/2023 0936   K 4.2 11/11/2023 0936   CL 104 11/11/2023 0936   CO2 22 11/11/2023 0936   GLUCOSE 85 11/11/2023 0936   BUN 10 11/11/2023 0936   CREATININE 0.96 11/11/2023 0936   CALCIUM 9.3 11/11/2023 0936   Lab Results  Component Value Date   HGBA1C 6.3 (H) 11/11/2023   Lab Results  Component Value Date   INSULIN 34.0 (H) 11/11/2023   Lab Results  Component Value Date   TSH 3.410 11/11/2023   CBC    Component Value Date/Time   WBC 4.7 11/11/2023 0936   RBC 4.91 11/11/2023 0936   HGB 12.5 11/11/2023 0936   HGB 12.4 03/24/2015 1342   HCT 40.0 11/11/2023 0936  PLT 372 11/11/2023 0936   MCV 82 11/11/2023 0936   MCH 25.5 (L) 11/11/2023 0936   MCHC 31.3 (L) 11/11/2023 0936   RDW 14.7 11/11/2023 0936   Iron Studies No results found for: "IRON", "TIBC", "FERRITIN", "IRONPCTSAT" Lipid Panel     Component Value Date/Time   CHOL 170 11/11/2023 0936   TRIG 61 11/11/2023 0936   HDL 46 11/11/2023 0936   LDLCALC 112 (H) 11/11/2023 0936   Hepatic Function Panel     Component Value Date/Time   PROT 7.3 11/11/2023 0936   ALBUMIN 4.0 11/11/2023 0936   AST 21 11/11/2023 0936   ALT 22 11/11/2023 0936   ALKPHOS 57 11/11/2023 0936   BILITOT <0.2 11/11/2023 0936      Component  Value Date/Time   TSH 3.410 11/11/2023 0936   Nutritional Lab Results  Component Value Date   VD25OH 31.6 11/11/2023     Assessment and Plan    Obesity   She adheres to a category three eating plan 95% of the time, resulting in a three-pound weight loss over the past two weeks. Due to a recent back strain, she is not currently exercising but is performing stretching exercises. She is not an emotional eater and is comfortable with a routine diet. Nutrition is emphasized as 80-90% of weight loss. As weight loss progresses, muscle strengthening will be necessary.   - Continue category three eating plan   - Encourage stretching exercises   - Advise to start light walking if feeling well   - Discuss muscle strengthening exercises in future visits    Hypertension   Her blood pressure is 133/85 mmHg, showing improvement. She is on chlorthalidone 12.5 mg. She reported heart palpitations, likely related to stress or dehydration rather than the medication. She manages dry mouth by drinking water and Powerade Zero.   - Continue chlorthalidone 12.5 mg   - Refill chlorthalidone prescription   - Advise to maintain hydration with water and Powerade Zero    Vitamin D Deficiency   She is on prescription vitamin D 50,000 IU weekly with no reported issues.   - Continue vitamin D 50,000 IU weekly   - Refill vitamin D prescription    Follow-up   She has appointments scheduled for April and May and prefers to keep them without scheduling a June appointment yet due to starting a new job.   - Maintain current appointments in April and May   - Contact via MyChart if any issues arise before the next appointment       She was informed of the importance of frequent follow up visits to maximize her success with intensive lifestyle modifications for her multiple health conditions.    Quillian Quince, MD

## 2024-02-19 ENCOUNTER — Encounter (INDEPENDENT_AMBULATORY_CARE_PROVIDER_SITE_OTHER): Payer: Self-pay | Admitting: Family Medicine

## 2024-02-19 ENCOUNTER — Ambulatory Visit (INDEPENDENT_AMBULATORY_CARE_PROVIDER_SITE_OTHER): Payer: Self-pay | Admitting: Family Medicine

## 2024-02-19 VITALS — BP 136/76 | HR 82 | Temp 98.9°F | Ht 66.0 in | Wt 219.0 lb

## 2024-02-19 DIAGNOSIS — Z6835 Body mass index (BMI) 35.0-35.9, adult: Secondary | ICD-10-CM

## 2024-02-19 DIAGNOSIS — E7849 Other hyperlipidemia: Secondary | ICD-10-CM

## 2024-02-19 DIAGNOSIS — E669 Obesity, unspecified: Secondary | ICD-10-CM

## 2024-02-19 DIAGNOSIS — I10 Essential (primary) hypertension: Secondary | ICD-10-CM

## 2024-02-19 DIAGNOSIS — E538 Deficiency of other specified B group vitamins: Secondary | ICD-10-CM

## 2024-02-19 DIAGNOSIS — E785 Hyperlipidemia, unspecified: Secondary | ICD-10-CM

## 2024-02-19 DIAGNOSIS — R7303 Prediabetes: Secondary | ICD-10-CM

## 2024-02-19 DIAGNOSIS — E559 Vitamin D deficiency, unspecified: Secondary | ICD-10-CM

## 2024-02-19 NOTE — Progress Notes (Signed)
 Office: [phone]  /  Fax: [phone]  WEIGHT SUMMARY AND BIOMETRICS  Anthropometric Measurements Height: 5\' 6"  (1.676 m) Weight: 219 lb (99.3 kg) BMI (Calculated): 35.36 Weight at Last Visit: 219 lb Weight Lost Since Last Visit: 0 Weight Gained Since Last Visit: 0 Starting Weight: 242 lb Total Weight Loss (lbs): 23 lb (10.4 kg) Peak Weight: 242 lb   Body Composition  Body Fat %: 41.7 % Fat Mass (lbs): 91.6 lbs Muscle Mass (lbs): 121.8 lbs Total Body Water (lbs): 83 lbs Visceral Fat Rating : 9   Other Clinical Data Fasting: yes Labs: no Today's Visit #: 8 Starting Date: 11/11/23    Chief Complaint: OBESITY     History of Present Illness Robin Hunt is a 33 year old female who presents for obesity treatment and progress assessment.  She follows her category three eating plan 95% of the time and engages in strength training exercises for 30 minutes twice a week. Despite these efforts, her weight has remained unchanged over the last three weeks since her last visit, although she has lost a total of 23 pounds so far.  She experiences a 'pinch' sensation on her left side, associated with her workouts. This sensation occurs in the back and buttocks area during workouts and on the side when her leg is bent back while working from home. Stretching the leg alleviates the discomfort. She is cautious with her exercises due to fear of aggravating her back, focusing on core exercises like dead bugs, bird dogs, standing marches, and around the world. She has tried single leg RDLs, which felt good.  She mentions an increase in hunger after starting her workouts last week. She follows a structured eating plan, occasionally forgetting to include string cheese for breakfast but compensates by adding mayo to her sandwich. She has tried jerky for the first time and found it enjoyable.  She is currently taking vitamin D 50,000 units weekly and is on a B12 rich diet. She inquires  about the water pill (chlorothiazide) she is taking, expressing confusion about its function in relation to hydration. Her mother, a Engineer, civil (consulting), is also curious about this. She is not on thyroid medication and her last thyroid function was normal.      PHYSICAL EXAM:  Blood pressure 136/76, pulse 82, temperature 98.9 F (37.2 C), height 5\' 6"  (1.676 m), weight 219 lb (99.3 kg), SpO2 100%. Body mass index is 35.35 kg/m.  DIAGNOSTIC DATA REVIEWED:  BMET    Component Value Date/Time   NA 139 11/11/2023 0936   K 4.2 11/11/2023 0936   CL 104 11/11/2023 0936   CO2 22 11/11/2023 0936   GLUCOSE 85 11/11/2023 0936   BUN 10 11/11/2023 0936   CREATININE 0.96 11/11/2023 0936   CALCIUM 9.3 11/11/2023 0936   Lab Results  Component Value Date   HGBA1C 6.3 (H) 11/11/2023   Lab Results  Component Value Date   INSULIN 34.0 (H) 11/11/2023   Lab Results  Component Value Date   TSH 3.410 11/11/2023   CBC    Component Value Date/Time   WBC 4.7 11/11/2023 0936   RBC 4.91 11/11/2023 0936   HGB 12.5 11/11/2023 0936   HGB 12.4 03/24/2015 1342   HCT 40.0 11/11/2023 0936   PLT 372 11/11/2023 0936   MCV 82 11/11/2023 0936   MCH 25.5 (L) 11/11/2023 0936   MCHC 31.3 (L) 11/11/2023 0936   RDW 14.7 11/11/2023 0936   Iron Studies No results found for: "IRON", "TIBC", "FERRITIN", "  IRONPCTSAT" Lipid Panel     Component Value Date/Time   CHOL 170 11/11/2023 0936   TRIG 61 11/11/2023 0936   HDL 46 11/11/2023 0936   LDLCALC 112 (H) 11/11/2023 0936   Hepatic Function Panel     Component Value Date/Time   PROT 7.3 11/11/2023 0936   ALBUMIN 4.0 11/11/2023 0936   AST 21 11/11/2023 0936   ALT 22 11/11/2023 0936   ALKPHOS 57 11/11/2023 0936   BILITOT <0.2 11/11/2023 0936      Component Value Date/Time   TSH 3.410 11/11/2023 0936   Nutritional Lab Results  Component Value Date   VD25OH 31.6 11/11/2023     Assessment and Plan Assessment & Plan Obesity She is adhering to a category  three eating plan 95% of the time and engaging in strength training exercises twice weekly. Her weight has remained stable over the last three weeks, with a total weight loss of 23 pounds. She reports a pinch sensation in the left hip and back, likely muscular due to new exercises, not consistent with sciatica. - Continue category three eating plan - Continue strength training exercises twice weekly - Monitor weight and adjust plan as needed - Reassure about muscular pain and advise easing into exercises - Avoid exercises that primarily use the back  Prediabetes She is managing prediabetes through diet and exercise. Labs will be monitored to assess progress. - Order labs to monitor progress, including A1c and insulin levels  Hyperlipidemia She is working on decreasing cholesterol through diet and exercise. She is not currently on a statin. Labs will be monitored to assess progress. - Order labs to assess cholesterol levels  Hypertension Blood pressure is well-controlled at 136/76. Discussed the importance of hydration and the mechanism of action of the diuretic. Labs will be checked to assess electrolytes and renal function. - Order labs to check electrolytes and renal function  Vitamin D deficiency She is on prescription vitamin D 50,000 IU weekly. Labs will be checked to assess progress. - Order labs to assess vitamin D levels  Vitamin B12 deficiency She is on a B12-rich diet. Labs will be checked to determine if the diet is sufficient to improve B12 levels. - Order labs to assess B12 levels      She was informed of the importance of frequent follow up visits to maximize her success with intensive lifestyle modifications for her multiple health conditions.    Quillian Quince, MD

## 2024-02-20 LAB — CMP14+EGFR
ALT: 13 IU/L (ref 0–32)
AST: 20 IU/L (ref 0–40)
Albumin: 4.4 g/dL (ref 3.9–4.9)
Alkaline Phosphatase: 59 IU/L (ref 44–121)
BUN/Creatinine Ratio: 13 (ref 9–23)
BUN: 14 mg/dL (ref 6–20)
Bilirubin Total: 0.2 mg/dL (ref 0.0–1.2)
CO2: 20 mmol/L (ref 20–29)
Calcium: 9.5 mg/dL (ref 8.7–10.2)
Chloride: 99 mmol/L (ref 96–106)
Creatinine, Ser: 1.08 mg/dL — ABNORMAL HIGH (ref 0.57–1.00)
Globulin, Total: 3.3 g/dL (ref 1.5–4.5)
Glucose: 75 mg/dL (ref 70–99)
Potassium: 4.1 mmol/L (ref 3.5–5.2)
Sodium: 139 mmol/L (ref 134–144)
Total Protein: 7.7 g/dL (ref 6.0–8.5)
eGFR: 69 mL/min/{1.73_m2} (ref >59)

## 2024-02-20 LAB — LIPID PANEL WITH LDL/HDL RATIO
Cholesterol, Total: 163 mg/dL (ref 100–199)
HDL: 38 mg/dL — ABNORMAL LOW (ref >39)
LDL Chol Calc (NIH): 111 mg/dL — ABNORMAL HIGH (ref 0–99)
LDL/HDL Ratio: 2.9 ratio (ref 0.0–3.2)
Triglycerides: 75 mg/dL (ref 0–149)
VLDL Cholesterol Cal: 14 mg/dL (ref 5–40)

## 2024-02-20 LAB — VITAMIN B12: Vitamin B-12: 432 pg/mL (ref 232–1245)

## 2024-02-20 LAB — INSULIN, RANDOM: INSULIN: 17.7 u[IU]/mL (ref 2.6–24.9)

## 2024-02-20 LAB — VITAMIN D 25 HYDROXY (VIT D DEFICIENCY, FRACTURES): Vit D, 25-Hydroxy: 61.9 ng/mL (ref 30.0–100.0)

## 2024-02-20 LAB — HEMOGLOBIN A1C
Est. average glucose Bld gHb Est-mCnc: 123 mg/dL
Hgb A1c MFr Bld: 5.9 % — ABNORMAL HIGH (ref 4.8–5.6)

## 2024-03-18 ENCOUNTER — Ambulatory Visit (INDEPENDENT_AMBULATORY_CARE_PROVIDER_SITE_OTHER): Payer: Self-pay | Admitting: Family Medicine

## 2024-03-18 ENCOUNTER — Encounter (INDEPENDENT_AMBULATORY_CARE_PROVIDER_SITE_OTHER): Payer: Self-pay | Admitting: Family Medicine

## 2024-03-18 VITALS — BP 135/83 | HR 87 | Temp 98.7°F | Ht 66.0 in | Wt 217.0 lb

## 2024-03-18 DIAGNOSIS — R7303 Prediabetes: Secondary | ICD-10-CM

## 2024-03-18 DIAGNOSIS — Z6835 Body mass index (BMI) 35.0-35.9, adult: Secondary | ICD-10-CM

## 2024-03-18 DIAGNOSIS — E785 Hyperlipidemia, unspecified: Secondary | ICD-10-CM

## 2024-03-18 DIAGNOSIS — E669 Obesity, unspecified: Secondary | ICD-10-CM

## 2024-03-18 DIAGNOSIS — I10 Essential (primary) hypertension: Secondary | ICD-10-CM

## 2024-03-18 DIAGNOSIS — E559 Vitamin D deficiency, unspecified: Secondary | ICD-10-CM

## 2024-03-18 DIAGNOSIS — E538 Deficiency of other specified B group vitamins: Secondary | ICD-10-CM

## 2024-03-18 MED ORDER — CHLORTHALIDONE 25 MG PO TABS: 12.5000 mg | ORAL_TABLET | Freq: Every day | ORAL | 0 refills | Status: AC

## 2024-03-18 MED ORDER — VITAMIN D (ERGOCALCIFEROL) 1.25 MG (50000 UNIT) PO CAPS: 50000.0000 [IU] | ORAL_CAPSULE | ORAL | 0 refills | Status: AC

## 2024-03-18 NOTE — Progress Notes (Signed)
 Office: [phone]  /  Fax: [phone]  WEIGHT SUMMARY AND BIOMETRICS  Anthropometric Measurements Height: 5\' 6"  (1.676 m) Weight: 217 lb (98.4 kg) BMI (Calculated): 35.04 Weight at Last Visit: 219 lb Weight Lost Since Last Visit: 2 lb Weight Gained Since Last Visit: 0 Starting Weight: 242 lb Total Weight Loss (lbs): 25 lb (11.3 kg) Peak Weight: 242 lb   Body Composition  Body Fat %: 40.4 % Fat Mass (lbs): 87.6 lbs Muscle Mass (lbs): 122.8 lbs Total Body Water (lbs): 83.4 lbs Visceral Fat Rating : 9   Other Clinical Data Fasting: No Labs: No Today's Visit #: 9 Starting Date: 11/11/23    Chief Complaint: OBESITY    History of Present Illness Robin Hunt is a 33 year old female with obesity who presents for obesity treatment and progress assessment.  She adheres to her prescribed category three plan approximately 95% of the time, which includes strengthening, cardio, and resistance training for 20 to 30 minutes, five times a week. She has lost two pounds in the last month since her last visit. She is dissatisfied with the weight loss, as she aims for body recomposition, hoping to lose fat and gain muscle.  She experiences challenges during her menstrual cycle, feeling extra bloated and not weighing her protein intake due to annoyance. Despite these challenges, she maintains that everything else has been consistent.  She has a history of hypertension and takes chlorthalidone  25 mg, half a pill per day. Her initial blood pressure reading was 143/79, but a repeat measurement in the office was 135/83. She maintains a blood pressure log at home, showing systolic readings between 110 to 120 and diastolic readings mostly in the 60s and 70s, indicating 'white coat syndrome'.  She has a history of vitamin D  deficiency and is on prescription vitamin D , 50,000 IU weekly. Recent labs showed an improvement in vitamin D  levels from 31 to 61.  She has a history of B12  deficiency, with recent labs showing an increase from 25 to 47, attributed to a B12-rich diet.  Recent labs indicated a normal fasting glucose level, with a three-month average improving from 6.3 to 5.9, indicating better blood sugar control. She is making half as much insulin  while achieving better control.  She is concerned about her cholesterol levels, noting a drop in HDL due to decreased exercise during the winter. Her LDL decreased by one point, and triglycerides remain normal.      PHYSICAL EXAM:  Blood pressure 135/83, pulse 87, temperature 98.7 F (37.1 C), height 5\' 6"  (1.676 m), weight 217 lb (98.4 kg), SpO2 100%. Body mass index is 35.02 kg/m.  DIAGNOSTIC DATA REVIEWED:  BMET    Component Value Date/Time   NA 139 02/19/2024 1233   K 4.1 02/19/2024 1233   CL 99 02/19/2024 1233   CO2 20 02/19/2024 1233   GLUCOSE 75 02/19/2024 1233   BUN 14 02/19/2024 1233   CREATININE 1.08 (H) 02/19/2024 1233   CALCIUM 9.5 02/19/2024 1233   Lab Results  Component Value Date   HGBA1C 5.9 (H) 02/19/2024   HGBA1C 6.3 (H) 11/11/2023   Lab Results  Component Value Date   INSULIN  17.7 02/19/2024   INSULIN  34.0 (H) 11/11/2023   Lab Results  Component Value Date   TSH 3.410 11/11/2023   CBC    Component Value Date/Time   WBC 4.7 11/11/2023 0936   RBC 4.91 11/11/2023 0936   HGB 12.5 11/11/2023 0936   HGB 12.4 03/24/2015 1342  HCT 40.0 11/11/2023 0936   PLT 372 11/11/2023 0936   MCV 82 11/11/2023 0936   MCH 25.5 (L) 11/11/2023 0936   MCHC 31.3 (L) 11/11/2023 0936   RDW 14.7 11/11/2023 0936   Iron Studies No results found for: "IRON", "TIBC", "FERRITIN", "IRONPCTSAT" Lipid Panel     Component Value Date/Time   CHOL 163 02/19/2024 1233   TRIG 75 02/19/2024 1233   HDL 38 (L) 02/19/2024 1233   LDLCALC 111 (H) 02/19/2024 1233   Hepatic Function Panel     Component Value Date/Time   PROT 7.7 02/19/2024 1233   ALBUMIN 4.4 02/19/2024 1233   AST 20 02/19/2024 1233    ALT 13 02/19/2024 1233   ALKPHOS 59 02/19/2024 1233   BILITOT 0.2 02/19/2024 1233      Component Value Date/Time   TSH 3.410 11/11/2023 0936   Nutritional Lab Results  Component Value Date   VD25OH 61.9 02/19/2024   VD25OH 31.6 11/11/2023     Assessment and Plan Assessment & Plan Hypertension Blood pressure readings in the office were initially elevated at 143/79 but improved to 135/83 upon repeat measurement. Home blood pressure log shows systolic readings between 110-120 and diastolic readings mostly in the 60s and 70s, indicating well-controlled blood pressure at home. The elevated office readings are attributed to white coat syndrome. - Continue chlorthalidone  12.5 mg daily. - Refill chlorthalidone  prescription.  Prediabetes Fasting glucose levels remain normal, and the three-month average has improved from 6.3 to 5.9, indicating better glycemic control. She is producing less insulin  while maintaining better blood sugar control, suggesting effective lifestyle modifications. She has a genetic predisposition for diabetes, necessitating ongoing monitoring. - Continue current dietary and exercise regimen. - Monitor blood glucose levels regularly.  Hyperlipidemia LDL cholesterol has decreased by one point, but the goal is to get it under 100. HDL cholesterol has dropped, likely due to decreased activity during the winter months, but is expected to improve with current exercise regimen. Triglycerides remain normal. No medication is indicated at this stage, and focus remains on lifestyle modifications. - Continue current exercise regimen and dietary modifications. - Reassess lipid panel in 3-4 months.  Obesity She has lost two pounds since the last visit, with body composition analysis indicating fat loss and muscle gain. She is following a category three weight loss plan with 95% adherence and engaging in regular exercise, including cardio and resistance training. She expresses some  dissatisfaction with the rate of weight loss but is reassured by the positive changes in body composition. - Continue current weight loss plan and exercise regimen. - Encourage regular hydration. - Provide dietary modification suggestions to maintain adherence. - Advise periodic weighing for accurate portion control.  Vitamin D  deficiency Vitamin D  levels have improved from 31 to 61, reaching the target range. She is currently on prescription vitamin D  supplementation, which has been effective in correcting the deficiency. - Continue prescription vitamin D  50,000 IU weekly. - Discuss potential future adjustments to vitamin D  supplementation, including possible transition to over-the-counter options.  B12 deficiency B12 levels have improved from 25 to 432, moving towards the target of 500 or greater. The improvement is attributed to dietary changes, specifically a B12-rich diet. - Continue dietary modifications to further improve B12 levels.    She was informed of the importance of frequent follow up visits to maximize her success with intensive lifestyle modifications for her multiple health conditions.    Jasmine Mesi, MD

## 2024-04-27 ENCOUNTER — Ambulatory Visit (INDEPENDENT_AMBULATORY_CARE_PROVIDER_SITE_OTHER): Payer: Self-pay | Admitting: Family Medicine

## 2024-05-25 ENCOUNTER — Ambulatory Visit (INDEPENDENT_AMBULATORY_CARE_PROVIDER_SITE_OTHER): Payer: Self-pay | Admitting: Family Medicine

## 2024-08-13 ENCOUNTER — Ambulatory Visit (HOSPITAL_BASED_OUTPATIENT_CLINIC_OR_DEPARTMENT_OTHER): Payer: Managed Care, Other (non HMO) | Admitting: Obstetrics & Gynecology

## 2024-08-13 ENCOUNTER — Ambulatory Visit (INDEPENDENT_AMBULATORY_CARE_PROVIDER_SITE_OTHER): Payer: Self-pay | Admitting: Family Medicine

## 2024-11-18 ENCOUNTER — Ambulatory Visit (INDEPENDENT_AMBULATORY_CARE_PROVIDER_SITE_OTHER)

## 2024-11-18 ENCOUNTER — Other Ambulatory Visit (HOSPITAL_COMMUNITY)
Admission: RE | Admit: 2024-11-18 | Discharge: 2024-11-18 | Disposition: A | Discharge status: Home / Self Care | Source: Ambulatory Visit | Attending: Obstetrics & Gynecology | Admitting: Obstetrics & Gynecology

## 2024-11-18 VITALS — BP 151/90 | HR 80 | Ht 65.0 in | Wt 225.0 lb

## 2024-11-18 DIAGNOSIS — N898 Other specified noninflammatory disorders of vagina: Secondary | ICD-10-CM | POA: Insufficient documentation

## 2024-11-18 NOTE — Progress Notes (Signed)
 NURSE VISIT- VAGINITIS/STD/POC  SUBJECTIVE:  Robin Hunt is a 34 y.o. G0P0000 GYN patientfemale here for a vaginal swab for vaginitis screening, STD screen.  She reports the following symptoms: odor and vulvar itching for a few days. Denies abnormal vaginal bleeding, significant pelvic pain, fever, or UTI symptoms.  OBJECTIVE:  BP (!) 151/90 Comment: Patient has been off of med for a few days  Pulse 80   Ht 5' 5 (1.651 m) Comment: Reported  Wt 225 lb (102.1 kg)   BMI 37.44 kg/m   Appears well, in no apparent distress  ASSESSMENT: Vaginal swab for STD screen  PLAN: Self-collected vaginal probe for Gonorrhea, Chlamydia, Trichomonas, Bacterial Vaginosis, Yeast, Yeast sent to lab Treatment: to be determined once results are received Follow-up as needed if symptoms persist/worsen, or new symptoms develop

## 2024-11-20 LAB — CERVICOVAGINAL ANCILLARY ONLY
Bacterial Vaginitis (gardnerella): POSITIVE — AB
Candida Glabrata: NEGATIVE
Candida Vaginitis: NEGATIVE
Chlamydia: NEGATIVE
Comment: NEGATIVE
Comment: NEGATIVE
Comment: NEGATIVE
Comment: NEGATIVE
Comment: NEGATIVE
Comment: NORMAL
Neisseria Gonorrhea: NEGATIVE
Trichomonas: NEGATIVE

## 2024-11-21 ENCOUNTER — Ambulatory Visit (HOSPITAL_BASED_OUTPATIENT_CLINIC_OR_DEPARTMENT_OTHER): Payer: Self-pay | Admitting: Obstetrics & Gynecology

## 2024-11-21 MED ORDER — METRONIDAZOLE 500 MG PO TABS: 500.0000 mg | ORAL_TABLET | Freq: Two times a day (BID) | ORAL | 0 refills | Status: AC

## 2024-12-15 ENCOUNTER — Ambulatory Visit (HOSPITAL_BASED_OUTPATIENT_CLINIC_OR_DEPARTMENT_OTHER): Admitting: Obstetrics & Gynecology

## 2025-02-18 ENCOUNTER — Ambulatory Visit (HOSPITAL_BASED_OUTPATIENT_CLINIC_OR_DEPARTMENT_OTHER): Admitting: Obstetrics & Gynecology
